# Patient Record
Sex: Female | Born: 1960 | Race: White | Hispanic: No | Marital: Married | Smoking: Never smoker
Health system: Southern US, Community
[De-identification: ages and names within clinical notes are randomized; demographics above are authoritative.]

## PROBLEM LIST (undated history)

## (undated) DIAGNOSIS — M8580 Other specified disorders of bone density and structure, unspecified site: Secondary | ICD-10-CM

## (undated) DIAGNOSIS — N289 Disorder of kidney and ureter, unspecified: Secondary | ICD-10-CM

## (undated) DIAGNOSIS — M1990 Unspecified osteoarthritis, unspecified site: Secondary | ICD-10-CM

## (undated) DIAGNOSIS — M898X9 Other specified disorders of bone, unspecified site: Secondary | ICD-10-CM

## (undated) DIAGNOSIS — T783XXA Angioneurotic edema, initial encounter: Secondary | ICD-10-CM

## (undated) HISTORY — PX: TYMPANOSTOMY TUBE PLACEMENT: SHX32

## (undated) HISTORY — DX: Angioneurotic edema, initial encounter: T78.3XXA

## (undated) HISTORY — PX: COLPOSCOPY: SHX161

## (undated) HISTORY — PX: ADENOIDECTOMY: SUR15

## (undated) HISTORY — PX: BUNIONECTOMY: SHX129

---

## 2014-08-12 ENCOUNTER — Encounter: Payer: Self-pay | Admitting: Podiatrist

## 2014-08-12 ENCOUNTER — Ambulatory Visit (INDEPENDENT_AMBULATORY_CARE_PROVIDER_SITE_OTHER): Payer: 59

## 2014-08-12 ENCOUNTER — Ambulatory Visit (INDEPENDENT_AMBULATORY_CARE_PROVIDER_SITE_OTHER): Payer: BLUE CROSS/BLUE SHIELD | Admitting: Podiatrist

## 2014-08-12 VITALS — BP 144/86 | HR 108 | Resp 15

## 2014-08-12 DIAGNOSIS — M2012 Hallux valgus (acquired), left foot: Secondary | ICD-10-CM

## 2014-08-12 NOTE — Patient Instructions (Addendum)
Bunionectomy A bunionectomy is surgery to remove a bunion. A bunion is an enlargement of the joint at the base of the big toe. It is made up of bone and soft tissue on the inside part of the joint. Over time, a painful lump appears on the inside of the joint. The big toe begins to point inward toward the second toe. New bone growth can occur and a bone spur may form. The pain eventually causes difficulty walking. A bunion usually results from inflammation caused by the irritation of poorly fitting shoes. It often begins later in life. A bunionectomy is performed when nonsurgical treatment no longer works. When surgery is needed, the extent of the procedure will depend on the degree of deformity of the foot. Your surgeon will discuss with you the different procedures and what will work best for you depending on your age and health. LET YOUR CAREGIVER KNOW ABOUT:  1. Previous problems with anesthetics or medicines used to numb the skin. 2. Allergies to dyes, iodine, foods, and/or latex. 3. Medicines taken including herbs, eye drops, prescription medicines (especially medicines used to "thin the blood"), aspirin and other over-the-counter medicines, and steroids (by mouth or as a cream). 4. History of bleeding or blood problems. 5. Possibility of pregnancy, if this applies. 6. History of blood clots in your legs and/or lungs . 7. Previous surgery. 8. Other important health problems. RISKS AND COMPLICATIONS   Infection.  Pain.  Nerve damage.  Possibility that the bunion will recur. BEFORE THE PROCEDURE  You should be present 60 minutes prior to your procedure or as directed.  PROCEDURE  Surgery is often done so that you can go home the same day (outpatient). It may be done in a hospital or in an outpatient surgical center. An anesthetic will be used to help you sleep during the procedure. Sometimes, a spinal anesthetic is used to make you numb below the waist. A cut (incision) is made over  the swollen area at the first joint of the big toe. The enlarged lump will be removed. If there is a need to reposition the bones of the big toe, this may require more than 1 incision. The bone itself may need to be cut. Screws and wires may be used in the repair. These can be removed at a later date. In severe cases, the entire joint may need to be removed and a joint replacement inserted. When done, the incision is closed with stitches (sutures). Skin adhesive strips may be added for reinforcement. They help hold the incision closed.  AFTER THE PROCEDURE  Compression bandages (dressings) are then wrapped around the wound. This helps to keep the foot in alignment and reduce swelling. Your foot will be monitored for bleeding and swelling. You will need to stay for a few hours in the recovery area before being discharged. This allows time for the anesthesia to wear off. You will be discharged home when you are awake, stable, and doing well. HOME CARE INSTRUCTIONS   You can expect to return to normal activities within 6 to 8 weeks after surgery. The foot is at increased risk for swelling for several months. When you can expect to bear weight on the operated foot will depend on the extent of your surgery. The milder the deformity, the less tissue is removed and the sooner the return to normal activity level. During the recovery period, a special shoe, boot, or cast may be worn to accommodate the surgical bandage and to help provide  stability to the foot.  Once you are home, an ice pack applied to the operative site may help with discomfort and keep swelling down. Stop using the ice if it causes discomfort.  Keep your feet raised (elevated) when possible to lessen swelling.  If you have an elastic bandage on your foot and you have numbness, tingling, or your foot becomes cold and blue, adjust the bandage to make it comfortable.  Change dressings as directed.  Keep the wound dry and clean. The wound may be  washed gently with soap and water. Gently blot dry without rubbing. Do not take baths or use swimming pools or hot tubs for 10 days, or as instructed by your caregiver.  Only take over-the-counter or prescription medicines for pain, discomfort, or fever as directed by your caregiver.  You may continue a normal diet as directed.  For activity, use crutches with no weight bearing or your orthopedic shoe as directed. Continue to use crutches or a cane as directed until you can stand without causing pain. SEEK MEDICAL CARE IF:   You have redness, swelling, bruising, or increasing pain in the wound.  There is pus coming from the wound.  You have drainage from a wound lasting longer than 1 day.  You have an oral temperature above 102 F (38.9 C).  You notice a bad smell coming from the wound or dressing.  The wound breaks open after sutures have been removed.  You develop dizzy episodes or fainting while standing.  You have persistent nausea or vomiting.  Your toes become cold.  Pain is not relieved with medicines. SEEK IMMEDIATE MEDICAL CARE IF:   You develop a rash.  You have difficulty breathing.  You develop any reaction or side effects to medicines given.  Your toes are numb or blue, or you have severe pain. MAKE SURE YOU:   Understand these instructions.  Will watch your condition.  Will get help right away if you are not doing well or get worse. Document Released: 09/06/2005 Document Revised: 12/16/2011 Document Reviewed: 10/12/2007 [facility] Patient Information 2015 [place], [place]. This information is not intended to replace advice given to you by your health care provider. Make sure you discuss any questions you have with your health care provider.  Pre-Operative Instructions  Congratulations, you have decided to take an important step to improving your quality of life.  You can be assured that the doctors of [facility] will be with you every step of the  way.  9. Plan to be at the surgery center/hospital at least 1 (one) hour prior to your scheduled time unless otherwise directed by the surgical center/hospital staff.  You must have a responsible adult accompany you, remain during the surgery and drive you home.  Make sure you have directions to the surgical center/hospital and know how to get there on time. 10. For hospital based surgery you will need to obtain a history and physical form from your family physician within 1 month prior to the date of surgery- we will give you a form for you primary physician.  11. We make every effort to accommodate the date you request for surgery.  There are however, times where surgery dates or times have to be moved.  We will contact you as soon as possible if a change in schedule is required.   12. No Aspirin/Ibuprofen for one week before surgery.  If you are on aspirin, any non-steroidal anti-inflammatory medications (Mobic, Aleve, Ibuprofen) you should stop taking it  7 days prior to your surgery.  You make take Tylenol  For pain prior to surgery.  13. Medications- If you are taking daily heart and blood pressure medications, seizure, reflux, allergy, asthma, anxiety, pain or diabetes medications, make sure the surgery center/hospital is aware before the day of surgery so they may notify you which medications to take or avoid the day of surgery. 14. No food or drink after midnight the night before surgery unless directed otherwise by surgical center/hospital staff. 15. No alcoholic beverages 24 hours prior to surgery.  No smoking 24 hours prior to or 24 hours after surgery. 16. Wear loose pants or shorts- loose enough to fit over bandages, boots, and casts. 17. No slip on shoes, sneakers are best. 18. Bring your boot with you to the surgery center/hospital.  Also bring crutches or a walker if your physician has prescribed it for you.  If you do not have this equipment, it will be provided for you after  surgery. 19. If you have not been contracted by the surgery center/hospital by the day before your surgery, call to confirm the date and time of your surgery. 20. Leave-time from work may vary depending on the type of surgery you have.  Appropriate arrangements should be made prior to surgery with your employer. 21. Prescriptions will be provided immediately following surgery by your doctor.  Have these filled as soon as possible after surgery and take the medication as directed. 22. Remove nail polish on the operative foot. 23. Wash the night before surgery.  The night before surgery wash the foot and leg well with the antibacterial soap provided and water paying special attention to beneath the toenails and in between the toes.  Rinse thoroughly with water and dry well with a towel.  Perform this wash unless told not to do so by your physician.  Enclosed: 1 Ice pack (please put in freezer the night before surgery)   1 Hibiclens skin cleaner   Pre-op Instructions  If you have any questions regarding the instructions, do not hesitate to call our office.  [place]: [address] [place], [place] [address] [phone]  [place]: [address] [place], [place] [address] [phone]  [place]: [address], [place] [address] [phone]  Dr. Santiago Bumpers DPM, Dr. Cristie Hem DPM Dr. Alvan Dame DPM, Dr. Ardelle Anton DPM, Dr. Marlowe Aschoff DPM

## 2014-08-12 NOTE — Progress Notes (Signed)
   Subjective:    Patient ID: Erin Jones, female    DOB: [birth year], 53 y.o.   MRN: [id number]  HPI Comments: "I have a bunion"  Patient C/O burning, aching 1st MPJ left for several months. She says it gets red and swells the more activity she does. Shoes are uncomfortable. No home treatment other than accommodative shoe gear.  Foot Pain Associated symptoms include arthralgias, fatigue and myalgias.      Review of Systems  Constitutional: Positive for fatigue.  HENT: Positive for sinus pressure.   Gastrointestinal: Positive for constipation.  Musculoskeletal: Positive for myalgias and arthralgias.  Allergic/Immunologic: Positive for environmental allergies.  Neurological: Positive for dizziness.  All other systems reviewed and are negative.      Objective:   Physical Exam Patient is awake, alert, and oriented x 3.  In no acute distress.  Vascular status is intact with palpable pedal pulses at 2/4 DP and PT bilateral and capillary refill time within normal limits. Neurological sensation is also intact bilaterally via Semmes Weinstein monofilament at 5/5 sites. Light touch, vibratory sensation, Achilles tendon reflex is intact. Dermatological exam reveals skin color, turger and texture as normal. No open lesions present.  Musculature intact with dorsiflexion, plantarflexion, inversion, eversion. moderate bunion deformity is present left.  Pain with range of motion is noted.  No crepitus within the joint is seen. X-rays confirm bunion left.    Assessment & Plan:  Bunion left foot  Discussed conservative versus surgical options. Recommended a Bunion correction with screw fixation. The consent form was discussed and all three pages were signed and the patient's questions were encouraged and answered to the best of my ability. Risks of the surgery were discussed including but not limited to continued pain, infection, swelling, elevated toe, decreased range of motion,  suture or implant  reaction, bleeding, decreased function, etc. Preoperative instructions were also dispensed to the patient as well as a preoperative surgical pamphlet to go along with the instructions. Surgery will be scheduled at the patients convenience and patient will be seen at [facility] specialty surgery center on outpatient basis.The patient is instructed to call if any questions or concerns arise.

## 2014-10-05 ENCOUNTER — Ambulatory Visit: Payer: 59 | Admitting: Podiatrist

## 2014-10-12 ENCOUNTER — Ambulatory Visit (INDEPENDENT_AMBULATORY_CARE_PROVIDER_SITE_OTHER): Payer: BLUE CROSS/BLUE SHIELD | Admitting: Podiatrist

## 2014-10-12 ENCOUNTER — Encounter: Payer: Self-pay | Admitting: Podiatrist

## 2014-10-12 VITALS — BP 140/78 | HR 77 | Resp 12

## 2014-10-12 DIAGNOSIS — M898X7 Other specified disorders of bone, ankle and foot: Secondary | ICD-10-CM

## 2014-10-12 DIAGNOSIS — M2570 Osteophyte, unspecified joint: Secondary | ICD-10-CM

## 2014-10-12 DIAGNOSIS — M2012 Hallux valgus (acquired), left foot: Secondary | ICD-10-CM

## 2014-10-12 NOTE — Patient Instructions (Signed)
Pre-Operative Instructions  Congratulations, you have decided to take an important step to improving your quality of life.  You can be assured that the doctors of [facility] will be with you every step of the way.  1. Plan to be at the surgery center/hospital at least 1 (one) hour prior to your scheduled time unless otherwise directed by the surgical center/hospital staff.  You must have a responsible adult accompany you, remain during the surgery and drive you home.  Make sure you have directions to the surgical center/hospital and know how to get there on time. 2. For hospital based surgery you will need to obtain a history and physical form from your family physician within 1 month prior to the date of surgery- we will give you a form for you primary physician.  3. We make every effort to accommodate the date you request for surgery.  There are however, times where surgery dates or times have to be moved.  We will contact you as soon as possible if a change in schedule is required.   4. No Aspirin/Ibuprofen for one week before surgery.  If you are on aspirin, any non-steroidal anti-inflammatory medications (Mobic, Aleve, Ibuprofen) you should stop taking it 7 days prior to your surgery.  You make take Tylenol  For pain prior to surgery.  5. Medications- If you are taking daily heart and blood pressure medications, seizure, reflux, allergy, asthma, anxiety, pain or diabetes medications, make sure the surgery center/hospital is aware before the day of surgery so they may notify you which medications to take or avoid the day of surgery. 6. No food or drink after midnight the night before surgery unless directed otherwise by surgical center/hospital staff. 7. No alcoholic beverages 24 hours prior to surgery.  No smoking 24 hours prior to or 24 hours after surgery. 8. Wear loose pants or shorts- loose enough to fit over bandages, boots, and casts. 9. No slip on shoes, sneakers are best. 10. Bring  your boot with you to the surgery center/hospital.  Also bring crutches or a walker if your physician has prescribed it for you.  If you do not have this equipment, it will be provided for you after surgery. 11. If you have not been contracted by the surgery center/hospital by the day before your surgery, call to confirm the date and time of your surgery. 12. Leave-time from work may vary depending on the type of surgery you have.  Appropriate arrangements should be made prior to surgery with your employer. 13. Prescriptions will be provided immediately following surgery by your doctor.  Have these filled as soon as possible after surgery and take the medication as directed. 14. Remove nail polish on the operative foot. 15. Wash the night before surgery.  The night before surgery wash the foot and leg well with the antibacterial soap provided and water paying special attention to beneath the toenails and in between the toes.  Rinse thoroughly with water and dry well with a towel.  Perform this wash unless told not to do so by your physician.  Enclosed: 1 Ice pack (please put in freezer the night before surgery)   1 Hibiclens skin cleaner   Pre-op Instructions  If you have any questions regarding the instructions, do not hesitate to call our office.  [place]: [address] [place], [place] [address] [phone]  [place]: [address] [place], [place] [address] [phone]  [place]: [address]  [place], [place] [address] [phone]  Dr. Richard   Tuchman DPM, Dr. Norman Regal DPM Dr. Richard Sikora DPM, Dr. M. Todd Hyatt DPM, Dr. Pablo Mathurin DPM 

## 2014-10-12 NOTE — Progress Notes (Signed)
Chief Complaint  Patient presents with  . Bunions    "I was scheduled for bunion surgery, and I was just thinking that since this 2nd toe was painful and rubbing the next toe maybe we could do something with that too." left bunion, and 2nd toe rubs the 3rd toe.     HPI: Patient is 54 y.o. female who presents today for follow-up of left bunion and now a second toe which is rubbing on the bunion site. Patient also states that she saw Dr. Dareen Piano and Dr. Dareen Piano recommended continuing with methotrexate post operatively.  Allergies  Allergen Reactions  . Sulfa Antibiotics     Physical Exam  Neurovascular status is intact and unchanged. Left hallux is laterally deviated impressing on the left second toe. The left second toe is not contracted but is abutting the hallux. Bunion deformity continues to be present in painful.  Assessment: Bunion deformity and lateral deviation of hallux abutting the left second toe  Plan: Discussed the surgical options again. Recommended fixing the bunion to see if this takes pressure off the left second toe and if not doing a procedure to correct the left second toe as well. This would consist of either an exostectomy or hammertoe repair to be decided intraoperatively. The patient demonstrates an understanding of this conversation and the consent forms were again were signed. Risks, benefits, alternatives were also discussed and the patient had ample time to ask questions that she saw fit. Patient's again were encouraged and answered to the best of my ability. She will be scheduled a [facility] specialty surgery center on outpatient basis.

## 2014-10-26 ENCOUNTER — Telehealth: Payer: Self-pay | Admitting: *Deleted

## 2014-10-26 NOTE — Telephone Encounter (Signed)
OK, sounds good.  Whenever is convenient is good for me.  (wednesdays )

## 2014-10-26 NOTE — Telephone Encounter (Signed)
"  I want to reschedule my surgery."  What day are you scheduled?  "I'm scheduled for February 3rd.  I'd like to reschedule it a week later for the 10th.  Hopefully all the snow will be over with."  Okay, I'll get it rescheduled

## 2014-11-16 DIAGNOSIS — M2012 Hallux valgus (acquired), left foot: Secondary | ICD-10-CM

## 2014-11-23 ENCOUNTER — Ambulatory Visit (INDEPENDENT_AMBULATORY_CARE_PROVIDER_SITE_OTHER): Payer: BLUE CROSS/BLUE SHIELD | Admitting: Podiatrist

## 2014-11-23 ENCOUNTER — Other Ambulatory Visit: Payer: Self-pay | Admitting: Podiatrist

## 2014-11-23 ENCOUNTER — Ambulatory Visit (INDEPENDENT_AMBULATORY_CARE_PROVIDER_SITE_OTHER): Payer: BLUE CROSS/BLUE SHIELD

## 2014-11-23 ENCOUNTER — Encounter: Payer: Self-pay | Admitting: Podiatrist

## 2014-11-23 VITALS — BP 143/86 | HR 104 | Resp 16

## 2014-11-23 DIAGNOSIS — M2012 Hallux valgus (acquired), left foot: Secondary | ICD-10-CM

## 2014-11-23 NOTE — Patient Instructions (Signed)
On Friday you may get your foot wet-- use your anklet (squeezy sock) when you feel comfortable-- until then, use a large bandaid over the incision and an ace wrap.  Stay in your boot and I'll see you back in 2 weeks.

## 2014-11-30 NOTE — Progress Notes (Signed)
   Subjective: Patient presents today1 week status post foot surgery of the left foot.  Date of surgery 11-16-14.  [place] bunion correction left.  Patient denies nausea, vomiting, fevers, chills or night sweats.  Denies calf pain or tenderness to the operative side.  Objective:  Neurovascular status is intact with palpable pedal pulses DP and PT bilateral at 2+ out of 4. Neurological sensation is intact and unchanged as per prior to surgery. Excellent appearance of the postoperative foot is noted.  No redness, drainage or signs of infection present.  Good alignment and position of the toe is noted.   Assessment: Status post left foot surgery- good post op progress  Plan:  Redressed the foot in a dry, sterile, compressive dressing.  She will stay in her boot for 2-3 weeks.  I will see her back for recheck in 1-2 weeks.

## 2014-12-07 ENCOUNTER — Ambulatory Visit: Payer: Self-pay

## 2014-12-07 ENCOUNTER — Ambulatory Visit (INDEPENDENT_AMBULATORY_CARE_PROVIDER_SITE_OTHER): Payer: BLUE CROSS/BLUE SHIELD | Admitting: Podiatrist

## 2014-12-07 ENCOUNTER — Encounter: Payer: Self-pay | Admitting: Podiatrist

## 2014-12-07 VITALS — BP 159/89 | HR 96 | Resp 12

## 2014-12-07 DIAGNOSIS — M069 Rheumatoid arthritis, unspecified: Secondary | ICD-10-CM

## 2014-12-07 DIAGNOSIS — Z9889 Other specified postprocedural states: Secondary | ICD-10-CM

## 2014-12-07 DIAGNOSIS — M2012 Hallux valgus (acquired), left foot: Secondary | ICD-10-CM

## 2014-12-07 NOTE — Progress Notes (Signed)
   Subjective: Patient presents today status post foot surgery of the left foot.  Date of surgery 11-16-14.  [place] bunion correction left. She is doing very well and is pleased with her progress. She's had very minimal swelling and has been doing her range of motion exercises as instructed she is also wearing the compressive anklet  Objective:  Neurovascular status is intact with palpable pedal pulses DP and PT bilateral at 2+ out of 4. Neurological sensation is intact and unchanged as per prior to surgery. Excellent appearance of the postoperative foot is noted.  No redness, drainage or signs of infection present.  Good alignment and position of the toe is noted both clinically and on x-ray.   Assessment: Status post left foot surgery- good post op progress  Plan:  Instructed Kyrstin to continue with her compressive anklet. She may start wearing a Darco shoe which was dispensed at today's visit. She will continue range of motion exercises and I will see her back in 3 weeks for follow-up

## 2014-12-08 DIAGNOSIS — M069 Rheumatoid arthritis, unspecified: Secondary | ICD-10-CM | POA: Insufficient documentation

## 2015-01-11 ENCOUNTER — Ambulatory Visit (INDEPENDENT_AMBULATORY_CARE_PROVIDER_SITE_OTHER): Payer: BLUE CROSS/BLUE SHIELD | Admitting: Podiatrist

## 2015-01-11 ENCOUNTER — Ambulatory Visit (INDEPENDENT_AMBULATORY_CARE_PROVIDER_SITE_OTHER): Payer: BLUE CROSS/BLUE SHIELD

## 2015-01-11 ENCOUNTER — Encounter: Payer: Self-pay | Admitting: Podiatrist

## 2015-01-11 VITALS — BP 156/95 | HR 101 | Resp 15

## 2015-01-11 DIAGNOSIS — Z9889 Other specified postprocedural states: Secondary | ICD-10-CM | POA: Diagnosis not present

## 2015-01-11 DIAGNOSIS — M2012 Hallux valgus (acquired), left foot: Secondary | ICD-10-CM

## 2015-01-11 NOTE — Patient Instructions (Signed)
Dr. Pollyann Savoy (rheumatologist)  [facility] [phone]  [address]  [place] [address]

## 2015-02-03 NOTE — Progress Notes (Signed)
   Subjective: Patient presents today status post foot surgery of the left foot.  Date of surgery 11-16-14.  [place] bunion correction left. She is doing very well and is pleased with her progress. She's had very minimal swelling and has been doing her range of motion exercises as instructed she is also wearing the compressive anklet  Objective:  Neurovascular status is intact with palpable pedal pulses DP and PT bilateral at 2+ out of 4. Neurological sensation is intact and unchanged as per prior to surgery. Excellent appearance of the postoperative foot is noted.  No redness, drainage or signs of infection present.  Good alignment and position of the toe is noted both clinically and on x-ray.   Assessment: Status post left foot surgery- good post op progress  Plan:  Instructed Nekayla to continue with her compressive anklet. She may wean out of her Darco shoe into a supportive anklet. She will continue range of motion exercises. Of note she also states that her rheumatologist Dr. Dareen Piano is leaving in that she is looking for a new rheumatologist. She does not see the PA and Dr. Ewell Poe office and Dr. Dierdre Forth in [facility] artery full. I did discuss with her Dr. Corliss Skains as an option as well.

## 2015-02-08 ENCOUNTER — Encounter: Payer: BLUE CROSS/BLUE SHIELD | Admitting: Podiatrist

## 2015-02-15 ENCOUNTER — Encounter: Payer: Self-pay | Admitting: Podiatrist

## 2015-02-15 ENCOUNTER — Ambulatory Visit (INDEPENDENT_AMBULATORY_CARE_PROVIDER_SITE_OTHER): Payer: BLUE CROSS/BLUE SHIELD | Admitting: Podiatrist

## 2015-02-15 ENCOUNTER — Ambulatory Visit: Payer: Self-pay

## 2015-02-15 VITALS — BP 140/80 | HR 94 | Resp 16

## 2015-02-15 DIAGNOSIS — Z9889 Other specified postprocedural states: Secondary | ICD-10-CM

## 2015-02-15 DIAGNOSIS — M2012 Hallux valgus (acquired), left foot: Secondary | ICD-10-CM

## 2015-02-17 NOTE — Progress Notes (Signed)
   Subjective: Patient presents today status post foot surgery of the left foot.  Date of surgery 11-16-14.  [place] bunion correction left. She is doing very well and is pleased with her progress. She's had some increase in swelling after moving some large pots in her yard and has had some redness along the joint. She is wearing the compressive anklet  Objective:  Neurovascular status is intact with palpable pedal pulses DP and PT bilateral at 2+ out of 4. Neurological sensation is intact and unchanged as per prior to surgery. Excellent appearance of the postoperative foot is noted.  No redness, drainage or signs of infection present.  Good alignment and position of the toe is noted both clinically and on x-ray.  Increase in swelling is present clinically no drainage, no malodor, no sign of infection present  Assessment: Status post left foot surgery- good post op progress  Plan: recommended ice and antiinflammatories. She will avoid any heavy lifting for another 4 weeks.  She is discharged from her post operative appointments but if any concerns arise in the future she will call.

## 2015-06-19 ENCOUNTER — Ambulatory Visit (INDEPENDENT_AMBULATORY_CARE_PROVIDER_SITE_OTHER): Payer: BLUE CROSS/BLUE SHIELD | Admitting: Podiatry

## 2015-06-19 ENCOUNTER — Encounter: Payer: Self-pay | Admitting: Podiatry

## 2015-06-19 ENCOUNTER — Ambulatory Visit (INDEPENDENT_AMBULATORY_CARE_PROVIDER_SITE_OTHER): Payer: BLUE CROSS/BLUE SHIELD

## 2015-06-19 VITALS — BP 149/97 | HR 100 | Resp 16

## 2015-06-19 DIAGNOSIS — Z9889 Other specified postprocedural states: Secondary | ICD-10-CM | POA: Diagnosis not present

## 2015-06-19 DIAGNOSIS — Z472 Encounter for removal of internal fixation device: Secondary | ICD-10-CM | POA: Diagnosis not present

## 2015-06-19 DIAGNOSIS — M2012 Hallux valgus (acquired), left foot: Secondary | ICD-10-CM | POA: Diagnosis not present

## 2015-06-21 NOTE — Progress Notes (Signed)
Subjective:     Patient ID: Erin Jones, female   DOB: 03-30-1961, 54 y.o.   MRN: [id number]  HPI patient presents stating I'm having pain on the top of my left foot and I'm not sure if it's due to the previous surgery I had. It is more discoloration than true pain but at times I do notice it   Review of Systems     Objective:   Physical Exam Neurovascular status intact muscle strength adequate range of motion within normal limits with well-healed surgical site first metatarsal left with wound edges well coapted and good range of motion of the first MPJ. Patient does have mild prominence in this area but I did not note specific acute area of discomfort    Assessment:     Possible abnormal screw position first metatarsal left versus skin irritation    Plan:     H&P and x-rays reviewed with patient. At this point I have recommended conservative care consisting of padding wider shoe and if symptoms were to persist or get worse we will need to consider removal of screw

## 2015-08-15 ENCOUNTER — Telehealth: Payer: Self-pay | Admitting: *Deleted

## 2015-08-16 NOTE — Telephone Encounter (Signed)
"  I was in in September to see Dr. Charlsie Merles.  I had a problem with my foot.  He said it was a screw on the top of my foot that needed to come out.  At the time it was not bothering me but it is bothering me now.  I was just wondering if a needed to set an appointment up and come in or will he be able to take it out in the office.  Exactly what do I need to do?  It hasn't been long since I had the x-rays.  I wonder if they can just tell me over the phone.

## 2015-08-17 NOTE — Telephone Encounter (Signed)
I attempted to return her call.  I left her a message to give me a call back. 

## 2015-08-18 ENCOUNTER — Telehealth: Payer: Self-pay | Admitting: *Deleted

## 2015-08-18 NOTE — Telephone Encounter (Signed)
Pt states someone called and she was out. I spoke with pt she states the area discussed at last visit is beginning to get red and more painful, she feels she is going to have to have the pin removed.  I told pt we would need to have her sign consent forms and schedule the surgery, and in many case depending on the pt's general health status, insurance coverage the surgery can be performed in the office. Pt was transferred to schedulers.

## 2015-08-23 ENCOUNTER — Ambulatory Visit (INDEPENDENT_AMBULATORY_CARE_PROVIDER_SITE_OTHER): Payer: BLUE CROSS/BLUE SHIELD | Admitting: Podiatry

## 2015-08-23 ENCOUNTER — Encounter: Payer: Self-pay | Admitting: Podiatry

## 2015-08-23 ENCOUNTER — Ambulatory Visit (INDEPENDENT_AMBULATORY_CARE_PROVIDER_SITE_OTHER): Payer: BLUE CROSS/BLUE SHIELD

## 2015-08-23 ENCOUNTER — Ambulatory Visit: Payer: BLUE CROSS/BLUE SHIELD | Admitting: Podiatry

## 2015-08-23 VITALS — Resp 16

## 2015-08-23 DIAGNOSIS — Z472 Encounter for removal of internal fixation device: Secondary | ICD-10-CM

## 2015-08-23 DIAGNOSIS — M79672 Pain in left foot: Secondary | ICD-10-CM | POA: Diagnosis not present

## 2015-08-23 DIAGNOSIS — M779 Enthesopathy, unspecified: Secondary | ICD-10-CM

## 2015-08-23 MED ORDER — TRIAMCINOLONE ACETONIDE 10 MG/ML IJ SUSP: 10.0000 mg | Freq: Once | INTRAMUSCULAR | Status: DC

## 2015-08-23 NOTE — Progress Notes (Signed)
Subjective:     Patient ID: Erin Jones, female   DOB: [birth year], 54 y.o.   MRN: [id number]  HPI patient presents on top of the left foot that there is an area of inflammation and she doesn't know if it's the screw or what can be causing it and she just has started Humira for her rheumatoid arthritis   Review of Systems     Objective:   Physical Exam Neurovascular status intact muscle strength adequate with inflammation of the first metatarsal head left of the localized nature with possible involvement of the screw or possibly just a more distal inflammation    Assessment:     Inflammatory capsulitis with possible screw involvement or small bone spur    Plan:     X-rays reviewed and today I did a careful injection of the capsule 3 mg Dexon the some Kenalog 5 mill grams Xylocaine and applied padding. Reappoint in 1 month and may consider surgical intervention at one point in the future

## 2015-09-13 ENCOUNTER — Ambulatory Visit: Payer: BLUE CROSS/BLUE SHIELD | Admitting: Podiatry

## 2015-09-18 ENCOUNTER — Ambulatory Visit (INDEPENDENT_AMBULATORY_CARE_PROVIDER_SITE_OTHER): Payer: BLUE CROSS/BLUE SHIELD | Admitting: Podiatry

## 2015-09-18 ENCOUNTER — Encounter: Payer: Self-pay | Admitting: Podiatry

## 2015-09-18 DIAGNOSIS — M2570 Osteophyte, unspecified joint: Secondary | ICD-10-CM

## 2015-09-18 DIAGNOSIS — Z472 Encounter for removal of internal fixation device: Secondary | ICD-10-CM | POA: Diagnosis not present

## 2015-09-18 DIAGNOSIS — M898X7 Other specified disorders of bone, ankle and foot: Secondary | ICD-10-CM

## 2015-09-19 NOTE — Progress Notes (Signed)
Subjective:     Patient ID: Erin Jones, female   DOB: 24-Dec-1960, 54 y.o.   MRN: [id number]  HPI patient states I'm still having pain but it is improved over where it was but I suspect I'm going to need surgery   Review of Systems     Objective:   Physical Exam Neurovascular status intact muscle strength adequate with diminishment of discomfort dorsum first metatarsal left but redness still noted and pain when palpated around the area of probable small bone spur or enlargement and possible irritation from screw    Assessment:     Improvement but pain still occurring in the dorsal first metatarsal head area    Plan:     Reviewed condition and x-rays with patient and I do think ultimately this will require excision border and a hold off continue to use wider shoes padding and if symptoms were to get worse is going to require surgical removal of dorsal bone spur along with removal of screw reappoint as needed

## 2016-02-23 ENCOUNTER — Other Ambulatory Visit (HOSPITAL_COMMUNITY): Payer: Self-pay | Admitting: Otolaryngology

## 2016-02-23 DIAGNOSIS — G8929 Other chronic pain: Secondary | ICD-10-CM

## 2016-02-23 DIAGNOSIS — R0981 Nasal congestion: Secondary | ICD-10-CM

## 2016-02-23 DIAGNOSIS — R519 Headache, unspecified: Secondary | ICD-10-CM

## 2016-02-23 DIAGNOSIS — R51 Headache: Secondary | ICD-10-CM

## 2016-02-27 ENCOUNTER — Ambulatory Visit (HOSPITAL_COMMUNITY)
Admission: RE | Admit: 2016-02-27 | Discharge: 2016-02-27 | Disposition: A | Discharge status: Home / Self Care | Payer: BLUE CROSS/BLUE SHIELD | Source: Ambulatory Visit | Attending: Otolaryngology | Admitting: Otolaryngology

## 2016-02-27 DIAGNOSIS — J328 Other chronic sinusitis: Secondary | ICD-10-CM | POA: Insufficient documentation

## 2016-02-27 DIAGNOSIS — R0981 Nasal congestion: Secondary | ICD-10-CM

## 2016-02-27 DIAGNOSIS — R51 Headache: Secondary | ICD-10-CM

## 2016-02-27 DIAGNOSIS — R519 Headache, unspecified: Secondary | ICD-10-CM

## 2016-10-03 ENCOUNTER — Ambulatory Visit (INDEPENDENT_AMBULATORY_CARE_PROVIDER_SITE_OTHER): Payer: BLUE CROSS/BLUE SHIELD | Admitting: Podiatry

## 2016-10-03 ENCOUNTER — Ambulatory Visit (INDEPENDENT_AMBULATORY_CARE_PROVIDER_SITE_OTHER): Payer: BLUE CROSS/BLUE SHIELD

## 2016-10-03 ENCOUNTER — Encounter: Payer: Self-pay | Admitting: Podiatry

## 2016-10-03 DIAGNOSIS — M779 Enthesopathy, unspecified: Secondary | ICD-10-CM | POA: Diagnosis not present

## 2016-10-03 DIAGNOSIS — G5782 Other specified mononeuropathies of left lower limb: Secondary | ICD-10-CM

## 2016-10-03 DIAGNOSIS — M79672 Pain in left foot: Secondary | ICD-10-CM

## 2016-10-03 DIAGNOSIS — G5762 Lesion of plantar nerve, left lower limb: Secondary | ICD-10-CM | POA: Diagnosis not present

## 2016-10-03 MED ORDER — TRIAMCINOLONE ACETONIDE 10 MG/ML IJ SUSP
10.0000 mg | Freq: Once | INTRAMUSCULAR | Status: AC
  Administered 2016-10-03: 10 mg

## 2016-10-03 NOTE — Progress Notes (Signed)
Subjective:     Patient ID: Erin Jones, female   DOB: 03/24/1961, 55 y.o.   MRN: [id number]  HPI patient states that she has developed pain in her left forefoot and it's been inflamed and makes it hard for her to walk comfortably   Review of Systems     Objective:   Physical Exam Neurovascular status intact with inflammatory changes left third MPJ with fluid buildup noted with no history of injury and doing well with postop surgical correction of bunion deformity left    Assessment:     What appears to be inflammatory capsulitis third MPJ left    Plan:     Explained condition and at this point I did do a proximal injection of the area allowed to get numb and then using sterile technique aspirated the joint getting out a small amount of clear fluid and injected with quarter cc deck some some Kenalog to reduce inflammation and applied plantar padding. Reappoint in several weeks to recheck  X-ray indicated screws in place left first metatarsal with no indication of stress fracture or arthritis of the third MPJ

## 2016-10-17 ENCOUNTER — Ambulatory Visit: Payer: BLUE CROSS/BLUE SHIELD | Admitting: Podiatry

## 2016-10-25 ENCOUNTER — Encounter: Payer: Self-pay | Admitting: Podiatry

## 2016-10-25 ENCOUNTER — Ambulatory Visit (INDEPENDENT_AMBULATORY_CARE_PROVIDER_SITE_OTHER): Payer: BLUE CROSS/BLUE SHIELD | Admitting: Podiatry

## 2016-10-25 VITALS — BP 147/91 | HR 98 | Resp 14

## 2016-10-25 DIAGNOSIS — M779 Enthesopathy, unspecified: Secondary | ICD-10-CM | POA: Diagnosis not present

## 2016-10-25 DIAGNOSIS — M79672 Pain in left foot: Secondary | ICD-10-CM | POA: Diagnosis not present

## 2016-10-27 NOTE — Progress Notes (Signed)
Subjective:     Patient ID: Erin Jones, female   DOB: Aug 29, 1961, 56 y.o.   MRN: [id number]  HPI patient presents stating I'm improved but if I don't have the pad to take pressure off the bones my foot still hurts quite a bit   Review of Systems     Objective:   Physical Exam Neurovascular status intact with continued inflammation pain around the lesser MPJ left with fluid buildup around the joint and inability to walk comfortably on the foot especially when not immobilize properly    Assessment:     Inflammatory capsulitis lesser MPJ left with pain    Plan:     H&P conditions reviewed and recommended orthotics to offload the area. Patient wants to have these made and is scanned for customized orthotic devices and was given advice on shoe gear modification

## 2016-11-14 ENCOUNTER — Ambulatory Visit (INDEPENDENT_AMBULATORY_CARE_PROVIDER_SITE_OTHER): Payer: Self-pay | Admitting: Podiatry

## 2016-11-14 DIAGNOSIS — M779 Enthesopathy, unspecified: Secondary | ICD-10-CM

## 2016-11-14 NOTE — Patient Instructions (Signed)

## 2016-11-15 ENCOUNTER — Ambulatory Visit: Payer: BLUE CROSS/BLUE SHIELD

## 2016-11-15 NOTE — Progress Notes (Signed)
Patient presents for orthotic pick up.  Verbal and written break in and wear instructions given.  Patient will follow up in 4 weeks if symptoms worsen or fail to improve. 

## 2016-12-31 ENCOUNTER — Other Ambulatory Visit: Payer: BLUE CROSS/BLUE SHIELD

## 2017-01-13 ENCOUNTER — Other Ambulatory Visit: Payer: BLUE CROSS/BLUE SHIELD

## 2017-01-14 ENCOUNTER — Other Ambulatory Visit: Payer: BLUE CROSS/BLUE SHIELD

## 2017-06-16 ENCOUNTER — Encounter (INDEPENDENT_AMBULATORY_CARE_PROVIDER_SITE_OTHER): Payer: Self-pay | Admitting: Internal Medicine

## 2017-07-14 ENCOUNTER — Ambulatory Visit (INDEPENDENT_AMBULATORY_CARE_PROVIDER_SITE_OTHER): Payer: BLUE CROSS/BLUE SHIELD | Admitting: Internal Medicine

## 2017-08-12 ENCOUNTER — Ambulatory Visit (INDEPENDENT_AMBULATORY_CARE_PROVIDER_SITE_OTHER): Payer: BLUE CROSS/BLUE SHIELD | Admitting: Internal Medicine

## 2017-10-06 ENCOUNTER — Other Ambulatory Visit: Payer: Self-pay | Admitting: Gastroenterology

## 2017-10-06 DIAGNOSIS — R1011 Right upper quadrant pain: Secondary | ICD-10-CM

## 2017-10-06 DIAGNOSIS — R079 Chest pain, unspecified: Secondary | ICD-10-CM

## 2017-10-09 ENCOUNTER — Other Ambulatory Visit: Payer: BLUE CROSS/BLUE SHIELD

## 2018-01-17 IMAGING — CT CT MAXILLOFACIAL W/O CM
3 of 4 series · 15 of 47 positions shown, 18 images · non-contrast
Comparison: None.

CLINICAL DATA: Sinus congestion.  Chronic intractable headache.

EXAM:
CT MAXILLOFACIAL WITHOUT CONTRAST
TECHNIQUE: Multidetector CT imaging of the maxillofacial structures was
performed. Multiplanar CT image reconstructions were also generated.
A small metallic BB was placed on the right temple in order to
reliably differentiate right from left.

[Series 3: axial sinus bone (burn to cd) · axial · 0.31mm/px · z∈[+87,+190]mm · 9 of 121 slices shown, 12 images]
[im 9/121  brain]
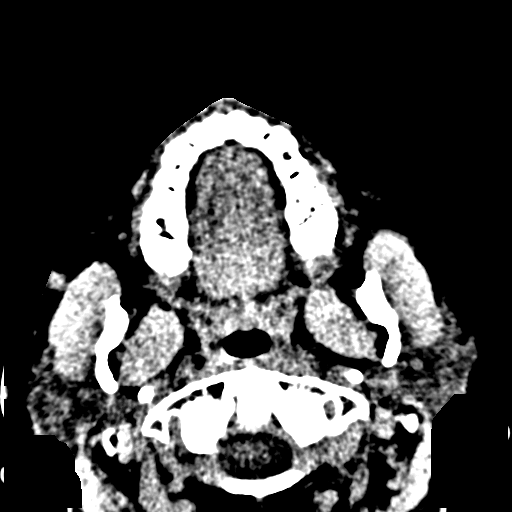
[im 9/121  bone]
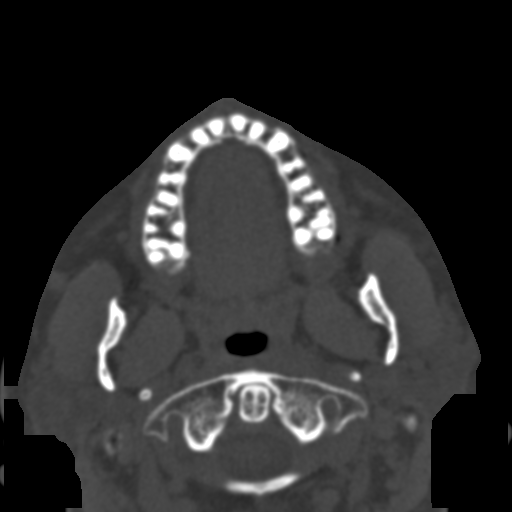
[im 26/121  bone]
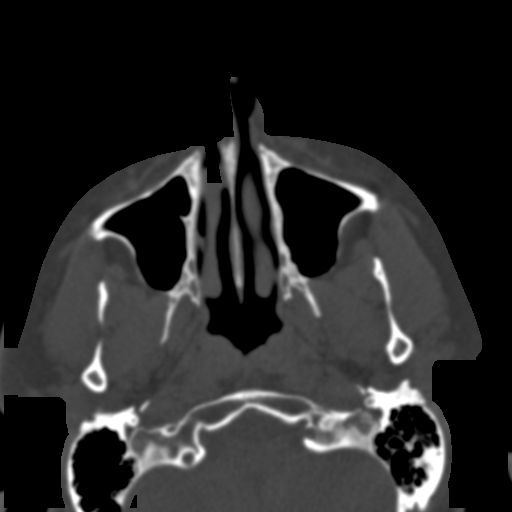
[im 35/121  bone]
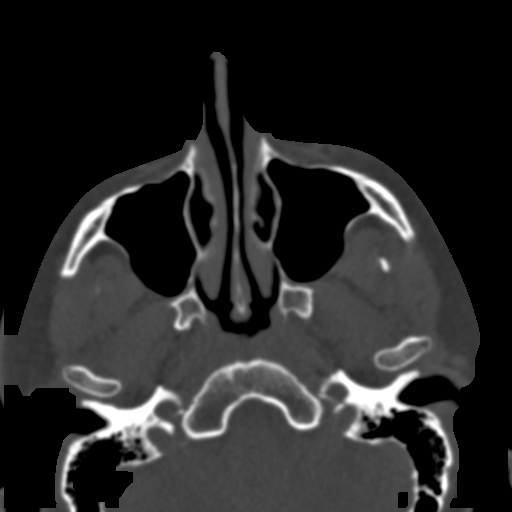
[im 52/121  bone]
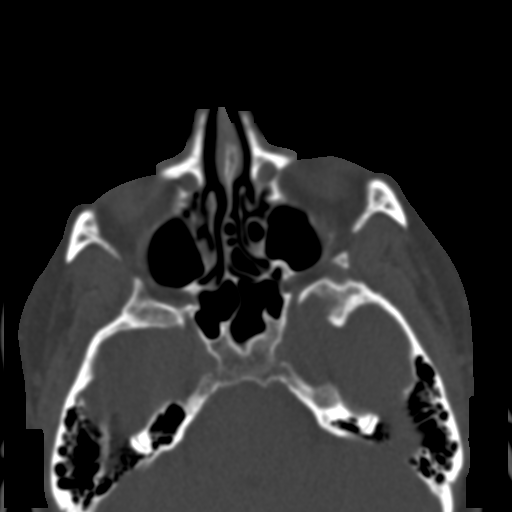
[im 61/121  brain]
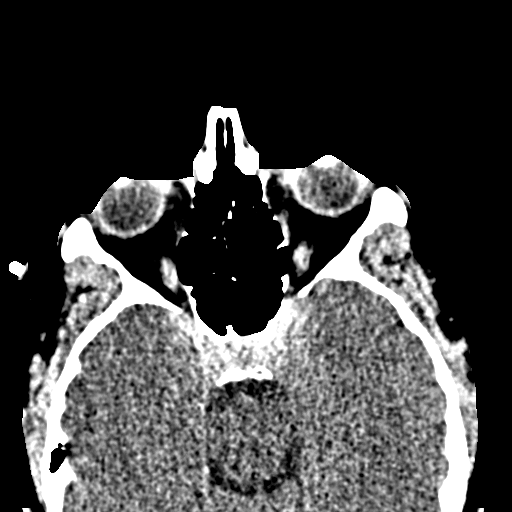
[im 61/121  bone]
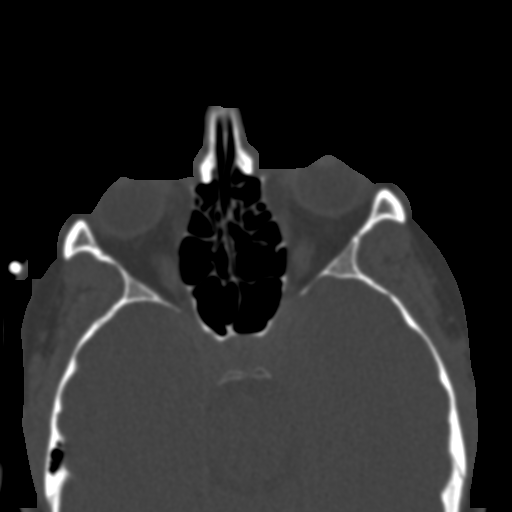
[im 69/121  bone]
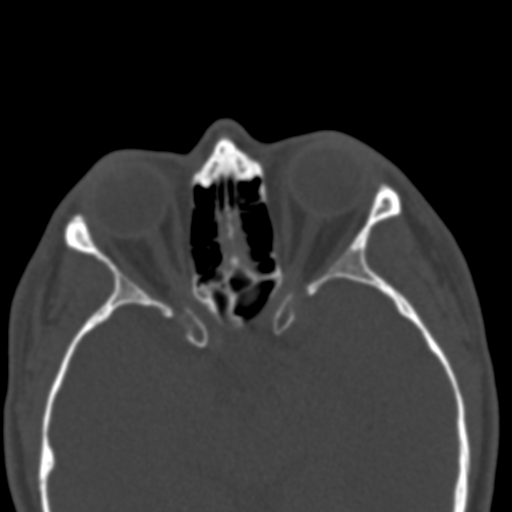
[im 86/121  bone]
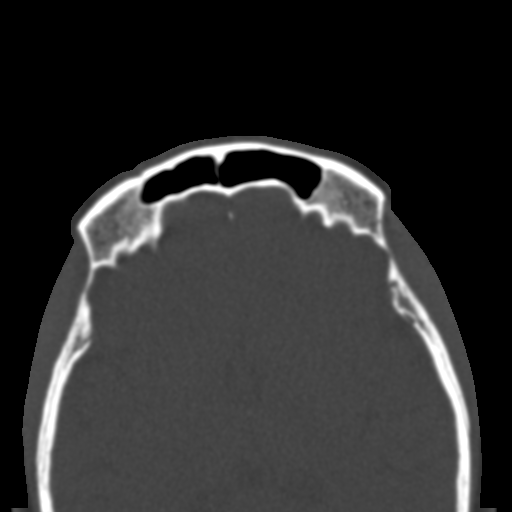
[im 95/121  bone]
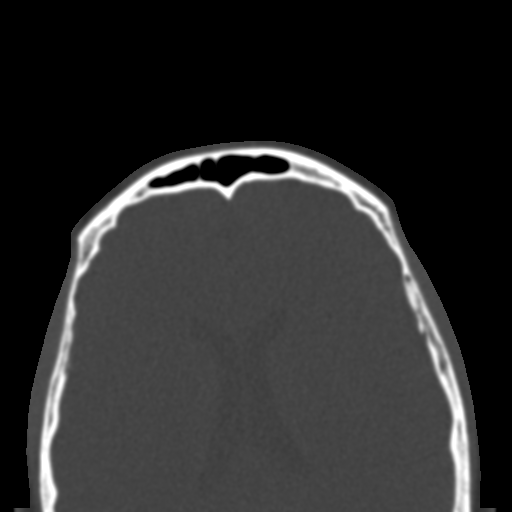
[im 112/121  brain]
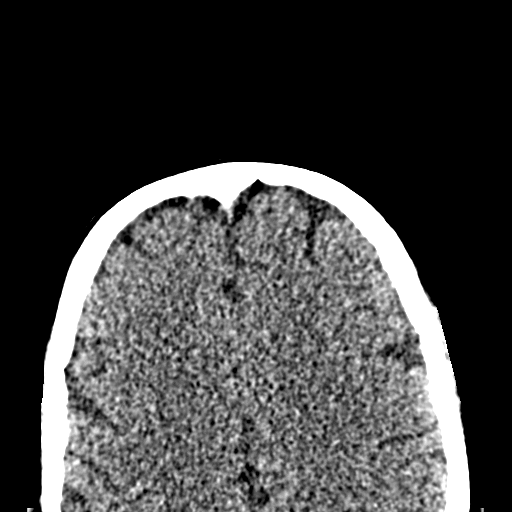
[im 112/121  bone]
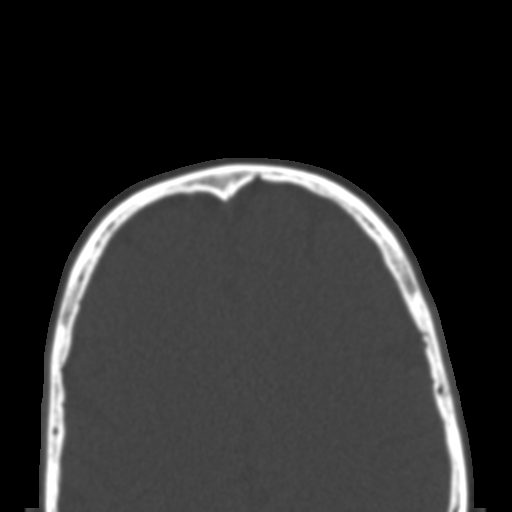

[Series 4: coronal sinus · coronal · 0.26mm/px · 3 of 81 slices shown]
[im 27/81  bone]
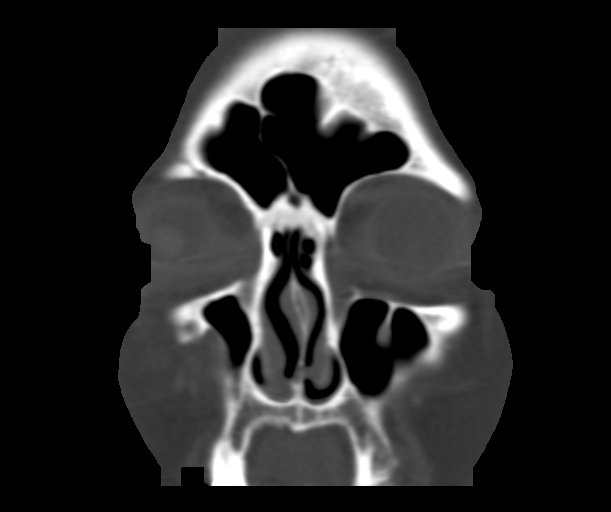
[im 36/81  bone]
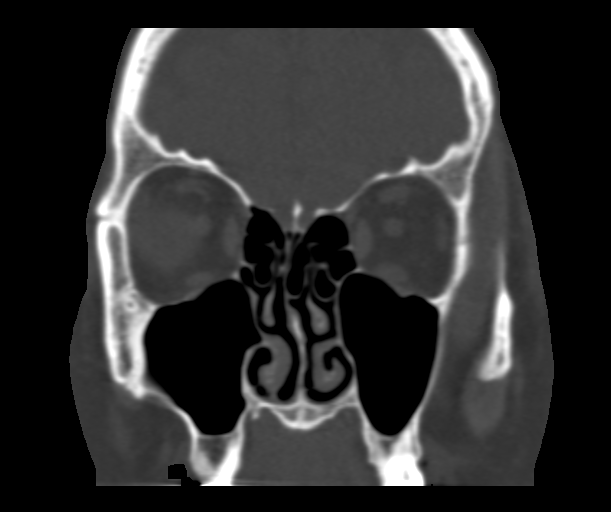
[im 45/81  bone]
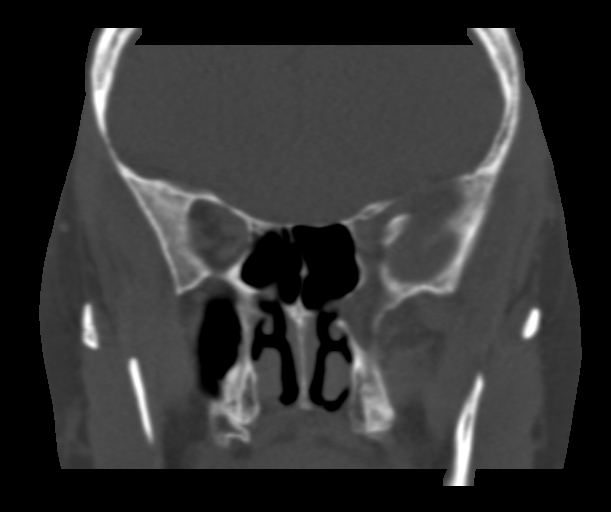

[Series 5: sagittal sinus · sagittal · 0.24mm/px · 3 of 80 slices shown]
[im 27/80  bone]
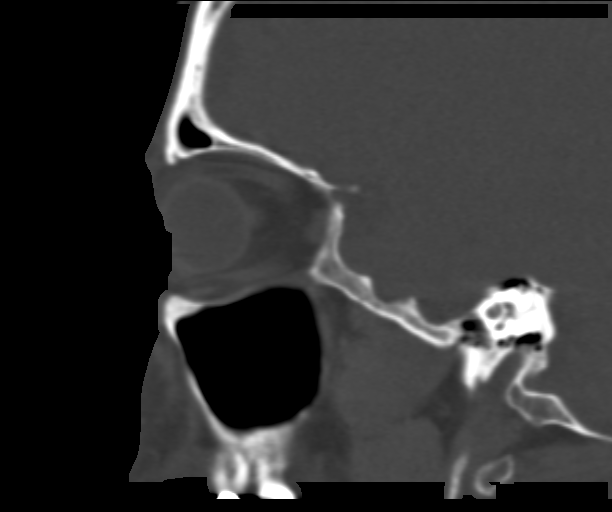
[im 40/80  bone]
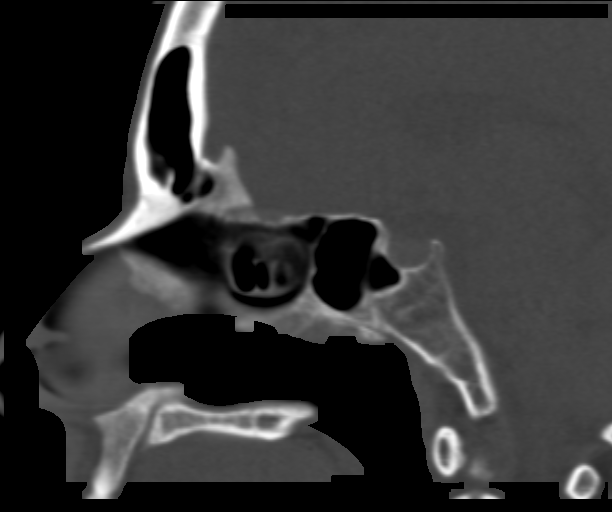
[im 53/80  bone]
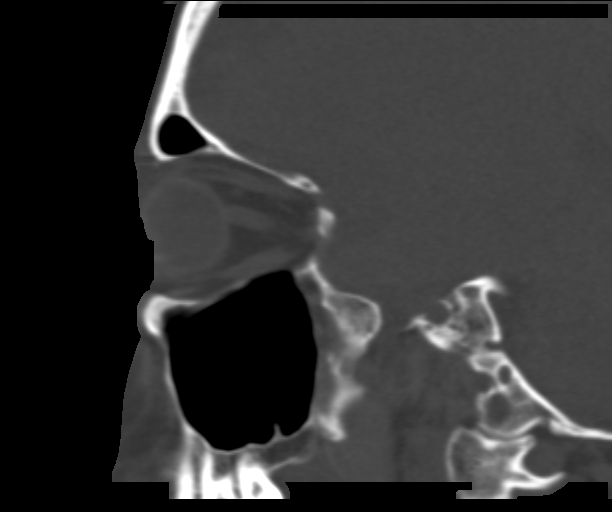

[15 of 47 positions shown; findings below may reference images not displayed]

FINDINGS: No acute sinusitis.  No mucosal thickening.

Patent bilateral maxillary infundibula. The right uncinate is more
vertical than the left with narrower infundibulum. Clear frontal
ethmoidal and sphenoid ethmoidal recesses. Minimal rotation of the
middle turbinates. No concha bullosa. No significant septal
deviation. No [place] or infraorbital air cell. Symmetric olfactory
recess depth. Intact medial orbital walls. Bone covered carotid and
optic canals. No incidental orbital or intracranial finding.
IMPRESSION: No active sinusitis or sinus obstruction.

## 2018-04-13 ENCOUNTER — Other Ambulatory Visit: Payer: Self-pay | Admitting: Gastroenterology

## 2018-04-13 DIAGNOSIS — R1013 Epigastric pain: Secondary | ICD-10-CM

## 2018-04-20 ENCOUNTER — Ambulatory Visit
Admission: RE | Admit: 2018-04-20 | Discharge: 2018-04-20 | Disposition: A | Discharge status: Home / Self Care | Payer: BLUE CROSS/BLUE SHIELD | Source: Ambulatory Visit | Attending: Gastroenterology | Admitting: Gastroenterology

## 2018-09-28 ENCOUNTER — Encounter (HOSPITAL_COMMUNITY): Payer: BLUE CROSS/BLUE SHIELD

## 2018-10-02 ENCOUNTER — Other Ambulatory Visit (HOSPITAL_COMMUNITY): Payer: Self-pay

## 2018-10-05 ENCOUNTER — Ambulatory Visit (HOSPITAL_COMMUNITY)
Admission: RE | Admit: 2018-10-05 | Discharge: 2018-10-05 | Disposition: A | Discharge status: Home / Self Care | Payer: BLUE CROSS/BLUE SHIELD | Source: Ambulatory Visit | Attending: Sports Medicine | Admitting: Sports Medicine

## 2018-10-05 DIAGNOSIS — M810 Age-related osteoporosis without current pathological fracture: Secondary | ICD-10-CM | POA: Insufficient documentation

## 2018-10-05 MED ORDER — ROMOSOZUMAB-AQQG 105 MG/1.17ML ~~LOC~~ SOSY
210.0000 mg | PREFILLED_SYRINGE | SUBCUTANEOUS | Status: DC
  Administered 2018-10-05: 210 mg via SUBCUTANEOUS
  Filled 2018-10-05: qty 2.4

## 2018-11-02 ENCOUNTER — Encounter (HOSPITAL_COMMUNITY)
Admission: RE | Admit: 2018-11-02 | Discharge: 2018-11-02 | Disposition: A | Discharge status: Home / Self Care | Payer: BLUE CROSS/BLUE SHIELD | Source: Ambulatory Visit | Attending: Sports Medicine | Admitting: Sports Medicine

## 2018-11-02 DIAGNOSIS — M810 Age-related osteoporosis without current pathological fracture: Secondary | ICD-10-CM | POA: Diagnosis present

## 2018-11-02 MED ORDER — ROMOSOZUMAB-AQQG 105 MG/1.17ML ~~LOC~~ SOSY
210.0000 mg | PREFILLED_SYRINGE | Freq: Once | SUBCUTANEOUS | Status: DC
  Administered 2018-11-02: 210 mg via SUBCUTANEOUS
  Filled 2018-11-02: qty 2.4

## 2018-11-30 ENCOUNTER — Ambulatory Visit (HOSPITAL_COMMUNITY)
Admission: RE | Admit: 2018-11-30 | Discharge: 2018-11-30 | Disposition: A | Discharge status: Home / Self Care | Payer: BLUE CROSS/BLUE SHIELD | Source: Ambulatory Visit | Attending: Sports Medicine | Admitting: Sports Medicine

## 2018-11-30 DIAGNOSIS — M810 Age-related osteoporosis without current pathological fracture: Secondary | ICD-10-CM | POA: Insufficient documentation

## 2018-11-30 MED ORDER — ROMOSOZUMAB-AQQG 105 MG/1.17ML ~~LOC~~ SOSY
210.0000 mg | PREFILLED_SYRINGE | Freq: Once | SUBCUTANEOUS | Status: AC
  Administered 2018-11-30: 210 mg via SUBCUTANEOUS
  Filled 2018-11-30: qty 2.4

## 2018-12-28 ENCOUNTER — Inpatient Hospital Stay (HOSPITAL_COMMUNITY): Admission: RE | Admit: 2018-12-28 | Payer: BLUE CROSS/BLUE SHIELD | Source: Ambulatory Visit

## 2019-01-25 ENCOUNTER — Encounter (HOSPITAL_COMMUNITY): Payer: BLUE CROSS/BLUE SHIELD

## 2019-01-28 ENCOUNTER — Ambulatory Visit (INDEPENDENT_AMBULATORY_CARE_PROVIDER_SITE_OTHER): Payer: BLUE CROSS/BLUE SHIELD | Admitting: Podiatry

## 2019-01-28 ENCOUNTER — Other Ambulatory Visit: Payer: Self-pay

## 2019-01-28 ENCOUNTER — Encounter: Payer: Self-pay | Admitting: Podiatry

## 2019-01-28 VITALS — Temp 97.3°F

## 2019-01-28 DIAGNOSIS — M2042 Other hammer toe(s) (acquired), left foot: Secondary | ICD-10-CM

## 2019-01-28 DIAGNOSIS — L84 Corns and callosities: Secondary | ICD-10-CM | POA: Diagnosis not present

## 2019-01-29 NOTE — Progress Notes (Signed)
Subjective:   Patient ID: Erin Jones, female   DOB: 58 y.o.   MRN: [id number]   HPI Patient's not been seen for several years and is concerned about a blister with the second digit left that is mildly tender   ROS      Objective:  Physical Exam  Neurovascular status intact with patient noted to have a deformed left second digit with pressure from the hallux occurring against the second toe with mild distal irritation of the second digit with small blister noted     Assessment:  Probability for trauma and structural deformity of the hallux second toe with irritation of the second digit     Plan:  H&P condition reviewed debrided the lesion sterile instrumentation advised on soaks applied padding and reappoint if symptoms were to recur

## 2019-09-22 ENCOUNTER — Other Ambulatory Visit (HOSPITAL_COMMUNITY): Payer: Self-pay | Admitting: *Deleted

## 2019-09-23 ENCOUNTER — Other Ambulatory Visit: Payer: Self-pay

## 2019-09-23 ENCOUNTER — Encounter (HOSPITAL_COMMUNITY)
Admission: RE | Admit: 2019-09-23 | Discharge: 2019-09-23 | Disposition: A | Discharge status: Home / Self Care | Payer: BC Managed Care – PPO | Source: Ambulatory Visit | Attending: Sports Medicine | Admitting: Sports Medicine

## 2019-09-23 DIAGNOSIS — M810 Age-related osteoporosis without current pathological fracture: Secondary | ICD-10-CM | POA: Insufficient documentation

## 2019-09-23 MED ORDER — ZOLEDRONIC ACID 5 MG/100ML IV SOLN: 5.0000 mg | Freq: Once | INTRAVENOUS | Status: AC

## 2019-09-23 MED ORDER — ZOLEDRONIC ACID 5 MG/100ML IV SOLN
INTRAVENOUS | Status: AC
  Administered 2019-09-23: 5 mg via INTRAVENOUS
  Filled 2019-09-23: qty 100

## 2019-09-23 NOTE — Discharge Instructions (Signed)

## 2019-10-06 ENCOUNTER — Ambulatory Visit: Payer: BLUE CROSS/BLUE SHIELD | Admitting: Podiatry

## 2019-10-20 ENCOUNTER — Ambulatory Visit: Payer: Self-pay | Admitting: Podiatry

## 2019-12-02 ENCOUNTER — Ambulatory Visit (INDEPENDENT_AMBULATORY_CARE_PROVIDER_SITE_OTHER): Payer: BC Managed Care – PPO | Admitting: Podiatry

## 2019-12-02 ENCOUNTER — Ambulatory Visit (INDEPENDENT_AMBULATORY_CARE_PROVIDER_SITE_OTHER): Payer: BC Managed Care – PPO

## 2019-12-02 ENCOUNTER — Other Ambulatory Visit: Payer: Self-pay | Admitting: Podiatry

## 2019-12-02 ENCOUNTER — Encounter: Payer: Self-pay | Admitting: Podiatry

## 2019-12-02 ENCOUNTER — Other Ambulatory Visit: Payer: Self-pay

## 2019-12-02 VITALS — Temp 97.2°F

## 2019-12-02 DIAGNOSIS — M779 Enthesopathy, unspecified: Secondary | ICD-10-CM

## 2019-12-02 DIAGNOSIS — M79672 Pain in left foot: Secondary | ICD-10-CM

## 2019-12-02 DIAGNOSIS — M778 Other enthesopathies, not elsewhere classified: Secondary | ICD-10-CM

## 2019-12-02 DIAGNOSIS — M7751 Other enthesopathy of right foot: Secondary | ICD-10-CM

## 2019-12-02 DIAGNOSIS — M7752 Other enthesopathy of left foot: Secondary | ICD-10-CM

## 2019-12-02 NOTE — Progress Notes (Signed)
4

## 2019-12-03 NOTE — Progress Notes (Signed)
Subjective:   Patient ID: Erin Jones, female   DOB: 59 y.o.   MRN: [id number]   HPI Patient presents stating that she is having a lot of pain in the left foot into the joints and has been going on for around 3 months but she is not been able to make it in because of Covid.  States it is very hard for her to walk neuro   ROS      Objective:  Physical Exam  Vascular status intact with patient's left third and fourth metatarsal showing inflammation fluid around the MPJs third and fourth left     Assessment:  Inflammatory capsulitis left forefoot     Plan:  H&P reviewed condition sterile prep done to the left forefoot and used utilize 60 mg like Marcaine mixture and I then aspirated the third and fourth MPJs getting out a small amount of clear fluid and injected quarter cc dexamethasone Kenalog in each joint along with thick plantar padding which was explained to patient.  Reappoint to recheck 2 weeks  X-rays indicate that the patient did not have any indication of fracture or arthritis associated with what she is experiencing

## 2019-12-16 ENCOUNTER — Encounter: Payer: Self-pay | Admitting: Podiatry

## 2019-12-16 ENCOUNTER — Ambulatory Visit (INDEPENDENT_AMBULATORY_CARE_PROVIDER_SITE_OTHER): Payer: BC Managed Care – PPO | Admitting: Podiatry

## 2019-12-16 ENCOUNTER — Ambulatory Visit: Payer: BC Managed Care – PPO | Admitting: Podiatry

## 2019-12-16 ENCOUNTER — Other Ambulatory Visit: Payer: Self-pay

## 2019-12-16 VITALS — Temp 97.2°F

## 2019-12-16 DIAGNOSIS — M2042 Other hammer toe(s) (acquired), left foot: Secondary | ICD-10-CM | POA: Diagnosis not present

## 2019-12-16 DIAGNOSIS — M779 Enthesopathy, unspecified: Secondary | ICD-10-CM | POA: Diagnosis not present

## 2019-12-16 NOTE — Progress Notes (Signed)
Subjective:   Patient ID: Erin Jones, female   DOB: 59 y.o.   MRN: [id number]   HPI Patient presents stating the pain has receded quite a bit but still present some and she had questions concerning orthotics and oral medications   ROS      Objective:  Physical Exam  Neurovascular status intact with diminishment of discomfort in the lesser MPJs left over right but prominent bone structure noted and flat     Assessment:  Inflammatory capsulitis lesser MPJs with prominent bone structure     Plan:  Reviewed her condition today discussed her orthotic which I reviewed and shoe gear that I want her to wear along with possible modifications and padding therapy and oral anti-inflammatories topicals as needed.  Patient will be seen back for [place] to recheck may require other treatment depending on response

## 2020-02-15 ENCOUNTER — Encounter (INDEPENDENT_AMBULATORY_CARE_PROVIDER_SITE_OTHER): Payer: Self-pay | Admitting: Otolaryngology

## 2020-02-15 ENCOUNTER — Other Ambulatory Visit: Payer: Self-pay

## 2020-02-15 ENCOUNTER — Ambulatory Visit (INDEPENDENT_AMBULATORY_CARE_PROVIDER_SITE_OTHER): Payer: BC Managed Care – PPO | Admitting: Otolaryngology

## 2020-02-15 VITALS — Temp 97.9°F

## 2020-02-15 DIAGNOSIS — H6123 Impacted cerumen, bilateral: Secondary | ICD-10-CM | POA: Diagnosis not present

## 2020-02-15 NOTE — Progress Notes (Signed)
HPI: Erin Jones is a 59 y.o. female who presents for evaluation of ear complaints.  Cachet describes itching in the left ear and the ear feels a little full.  She is on medication for rheumatoid arthritis and started a new medication recently.  She also just received her Covid vaccine x2 from [facility].  She has had some intermittent chills since the vaccine.  But no documented fevers. She does have history of allergies and rhinitis and also needed a refill of her Singulair that she takes.  She inquires about any treatment for itching in the left ear.  No past medical history on file. No past surgical history on file. Social History   Socioeconomic History  . Marital status: Married    Spouse name: Not on file  . Number of children: Not on file  . Years of education: Not on file  . Highest education level: Not on file  Occupational History  . Not on file  Tobacco Use  . Smoking status: Never Smoker  . Smokeless tobacco: Never Used  Substance and Sexual Activity  . Alcohol use: No    Alcohol/week: 0.0 standard drinks  . Drug use: No  . Sexual activity: Not on file  Other Topics Concern  . Not on file  Social History Narrative  . Not on file   Social Determinants of Health   Financial Resource Strain:   . Difficulty of Paying Living Expenses:   Food Insecurity:   . Worried About Programme researcher, broadcasting/film/video in the Last Year:   . Barista in the Last Year:   Transportation Needs:   . Freight forwarder (Medical):   Marland Kitchen Lack of Transportation (Non-Medical):   Physical Activity:   . Days of Exercise per Week:   . Minutes of Exercise per Session:   Stress:   . Feeling of Stress :   Social Connections:   . Frequency of Communication with Friends and Family:   . Frequency of Social Gatherings with Friends and Family:   . Attends Religious Services:   . Active Member of Clubs or Organizations:   . Attends Banker Meetings:   Marland Kitchen Marital Status:    No family history  on file. Allergies  Allergen Reactions  . Sulfa Antibiotics   . Other Rash    Neosporin   Prior to Admission medications   Medication Sig Start Date End Date Taking? Authorizing Provider  cycloSPORINE (RESTASIS) 0.05 % ophthalmic emulsion USE 1 DROP IN BOTH EYES TWICE A DAY 08/08/19  Yes [provider]  Eszopiclone 3 MG TABS TAKE 1 TABLET BY MOUTH AT BEDTIME *STOP ATIVAN* 10/05/19  Yes [provider]  Methotrexate Sodium (METHOTREXATE PO) Take by mouth.   Yes [provider]  valACYclovir (VALTREX) 500 MG tablet Take by mouth. 07/27/19  Yes [provider]     Positive ROS: Otherwise negative  All other systems have been reviewed and were otherwise negative with the exception of those mentioned in the HPI and as above.  Physical Exam: Constitutional: Alert, well-appearing, no acute distress Ears: External ears without lesions or tenderness.  She has a little bit of wax buildup in her ears that was removed with curette and forceps from use of previous Q-tips but she has minimal wax buildup.  Both TMs are clear with good mobility on pneumatic otoscopy with no middle ear abnormality noted.  The itching in the left ear is more the lateral portion of the ear and this  is clear. Nasal: External nose without lesions. Septum midline with mild rhinitis.. Clear nasal passages Oral: Lips and gums without lesions. Tongue and palate mucosa without lesions. Posterior oropharynx clear. Neck: No palpable adenopathy or masses Respiratory: Breathing comfortably  Skin: No facial/neck lesions or rash noted.  Cerumen impaction removal  Date/Time: 02/15/2020 6:00 PM Performed by: Rozetta Nunnery, MD Authorized by: Rozetta Nunnery, MD   Consent:    Consent obtained:  Verbal   Consent given by:  Patient   Risks discussed:  Pain and bleeding Procedure details:    Location:  L ear and R ear   Procedure type: curette and forceps   Post-procedure details:     Inspection:  TM intact and canal normal   Hearing quality:  Improved   Patient tolerance of procedure:  Tolerated well, no immediate complications Comments:     TMs are clear bilaterally.    Assessment: Minimal wax buildup with clear canals and clear TMs bilaterally. Mild rhinitis  Plan: Refilled her Singulair with 90 and refills. I also prescribed Diprolene 0.05% cream to use twice daily as needed itching. Recommended not using Q-tips in her ear canals. She will follow-up as needed.  Radene Journey, MD

## 2020-03-11 ENCOUNTER — Encounter (HOSPITAL_COMMUNITY): Payer: Self-pay

## 2020-03-11 ENCOUNTER — Inpatient Hospital Stay (HOSPITAL_COMMUNITY)
Admission: EM | Admit: 2020-03-11 | Discharge: 2020-03-15 | DRG: 392 | Disposition: A | Discharge status: Home / Self Care | Payer: BC Managed Care – PPO | Attending: Internal Medicine | Admitting: Internal Medicine

## 2020-03-11 ENCOUNTER — Other Ambulatory Visit: Payer: Self-pay

## 2020-03-11 DIAGNOSIS — M779 Enthesopathy, unspecified: Secondary | ICD-10-CM

## 2020-03-11 DIAGNOSIS — Z8349 Family history of other endocrine, nutritional and metabolic diseases: Secondary | ICD-10-CM

## 2020-03-11 DIAGNOSIS — Z79899 Other long term (current) drug therapy: Secondary | ICD-10-CM

## 2020-03-11 DIAGNOSIS — M069 Rheumatoid arthritis, unspecified: Secondary | ICD-10-CM | POA: Diagnosis present

## 2020-03-11 DIAGNOSIS — G4700 Insomnia, unspecified: Secondary | ICD-10-CM | POA: Diagnosis present

## 2020-03-11 DIAGNOSIS — K5792 Diverticulitis of intestine, part unspecified, without perforation or abscess without bleeding: Secondary | ICD-10-CM | POA: Diagnosis not present

## 2020-03-11 DIAGNOSIS — Z803 Family history of malignant neoplasm of breast: Secondary | ICD-10-CM

## 2020-03-11 DIAGNOSIS — K5720 Diverticulitis of large intestine with perforation and abscess without bleeding: Secondary | ICD-10-CM | POA: Diagnosis not present

## 2020-03-11 DIAGNOSIS — R300 Dysuria: Secondary | ICD-10-CM | POA: Diagnosis present

## 2020-03-11 DIAGNOSIS — Z20822 Contact with and (suspected) exposure to covid-19: Secondary | ICD-10-CM | POA: Diagnosis present

## 2020-03-11 DIAGNOSIS — Z882 Allergy status to sulfonamides status: Secondary | ICD-10-CM

## 2020-03-11 DIAGNOSIS — Z87442 Personal history of urinary calculi: Secondary | ICD-10-CM

## 2020-03-11 DIAGNOSIS — K5909 Other constipation: Secondary | ICD-10-CM | POA: Diagnosis present

## 2020-03-11 HISTORY — DX: Unspecified osteoarthritis, unspecified site: M19.90

## 2020-03-11 HISTORY — DX: Other specified disorders of bone density and structure, unspecified site: M85.80

## 2020-03-11 HISTORY — DX: Disorder of kidney and ureter, unspecified: N28.9

## 2020-03-11 HISTORY — DX: Other specified disorders of bone, unspecified site: M89.8X9

## 2020-03-11 MED ORDER — ACETAMINOPHEN 500 MG PO TABS
1000.0000 mg | ORAL_TABLET | Freq: Once | ORAL | Status: AC
  Administered 2020-03-11: 1000 mg via ORAL
  Filled 2020-03-11: qty 2

## 2020-03-11 NOTE — ED Triage Notes (Signed)
Pt reports lower abdominal pain apprx 1700 today after her sister visited with her today. Pt describes pain as cramping.  Pt then began developing body chills. Pt reports nausea without emesis or diarrhea.

## 2020-03-12 ENCOUNTER — Encounter (HOSPITAL_COMMUNITY): Payer: Self-pay | Admitting: Internal Medicine

## 2020-03-12 ENCOUNTER — Emergency Department (HOSPITAL_COMMUNITY): Payer: BC Managed Care – PPO

## 2020-03-12 DIAGNOSIS — Z20822 Contact with and (suspected) exposure to covid-19: Secondary | ICD-10-CM | POA: Diagnosis present

## 2020-03-12 DIAGNOSIS — K5720 Diverticulitis of large intestine with perforation and abscess without bleeding: Principal | ICD-10-CM

## 2020-03-12 DIAGNOSIS — K5792 Diverticulitis of intestine, part unspecified, without perforation or abscess without bleeding: Secondary | ICD-10-CM | POA: Diagnosis present

## 2020-03-12 DIAGNOSIS — Z803 Family history of malignant neoplasm of breast: Secondary | ICD-10-CM | POA: Diagnosis not present

## 2020-03-12 DIAGNOSIS — K5909 Other constipation: Secondary | ICD-10-CM | POA: Diagnosis present

## 2020-03-12 DIAGNOSIS — R300 Dysuria: Secondary | ICD-10-CM | POA: Diagnosis present

## 2020-03-12 DIAGNOSIS — Z882 Allergy status to sulfonamides status: Secondary | ICD-10-CM | POA: Diagnosis not present

## 2020-03-12 DIAGNOSIS — M069 Rheumatoid arthritis, unspecified: Secondary | ICD-10-CM | POA: Diagnosis present

## 2020-03-12 DIAGNOSIS — Z87442 Personal history of urinary calculi: Secondary | ICD-10-CM | POA: Diagnosis not present

## 2020-03-12 DIAGNOSIS — Z79899 Other long term (current) drug therapy: Secondary | ICD-10-CM | POA: Diagnosis not present

## 2020-03-12 DIAGNOSIS — G4700 Insomnia, unspecified: Secondary | ICD-10-CM | POA: Diagnosis present

## 2020-03-12 DIAGNOSIS — Z8349 Family history of other endocrine, nutritional and metabolic diseases: Secondary | ICD-10-CM | POA: Diagnosis not present

## 2020-03-12 LAB — CBC WITH DIFFERENTIAL/PLATELET
Abs Immature Granulocytes: 0.07 10*3/uL (ref 0.00–0.07)
Abs Immature Granulocytes: 0.07 10*3/uL (ref 0.00–0.07)
Basophils Absolute: 0 10*3/uL (ref 0.0–0.1)
Basophils Absolute: 0 10*3/uL (ref 0.0–0.1)
Basophils Relative: 0 %
Basophils Relative: 0 %
Eosinophils Absolute: 0 10*3/uL (ref 0.0–0.5)
Eosinophils Absolute: 0 10*3/uL (ref 0.0–0.5)
Eosinophils Relative: 0 %
Eosinophils Relative: 0 %
HCT: 39 % (ref 36.0–46.0)
HCT: 35.1 % — ABNORMAL LOW (ref 36.0–46.0)
Hemoglobin: 13.1 g/dL (ref 12.0–15.0)
Hemoglobin: 11.8 g/dL — ABNORMAL LOW (ref 12.0–15.0)
Immature Granulocytes: 0 %
Immature Granulocytes: 0 %
Lymphocytes Relative: 5 %
Lymphocytes Relative: 11 %
Lymphs Abs: 0.9 10*3/uL (ref 0.7–4.0)
Lymphs Abs: 1.8 10*3/uL (ref 0.7–4.0)
MCH: 33.2 pg (ref 26.0–34.0)
MCH: 33.3 pg (ref 26.0–34.0)
MCHC: 33.6 g/dL (ref 30.0–36.0)
MCHC: 33.6 g/dL (ref 30.0–36.0)
MCV: 99 fL (ref 80.0–100.0)
MCV: 99.2 fL (ref 80.0–100.0)
Monocytes Absolute: 1.1 10*3/uL — ABNORMAL HIGH (ref 0.1–1.0)
Monocytes Absolute: 1 10*3/uL (ref 0.1–1.0)
Monocytes Relative: 7 %
Monocytes Relative: 6 %
Neutro Abs: 14.5 10*3/uL — ABNORMAL HIGH (ref 1.7–7.7)
Neutro Abs: 12.9 10*3/uL — ABNORMAL HIGH (ref 1.7–7.7)
Neutrophils Relative %: 88 %
Neutrophils Relative %: 83 %
Platelets: 278 10*3/uL (ref 150–400)
Platelets: 244 10*3/uL (ref 150–400)
RBC: 3.94 MIL/uL (ref 3.87–5.11)
RBC: 3.54 MIL/uL — ABNORMAL LOW (ref 3.87–5.11)
RDW: 13.8 % (ref 11.5–15.5)
RDW: 14 % (ref 11.5–15.5)
WBC: 16.6 10*3/uL — ABNORMAL HIGH (ref 4.0–10.5)
WBC: 15.8 10*3/uL — ABNORMAL HIGH (ref 4.0–10.5)
nRBC: 0 % (ref 0.0–0.2)
nRBC: 0 % (ref 0.0–0.2)

## 2020-03-12 LAB — COMPREHENSIVE METABOLIC PANEL
ALT: 24 U/L (ref 0–44)
AST: 26 U/L (ref 15–41)
Albumin: 4.8 g/dL (ref 3.5–5.0)
Alkaline Phosphatase: 46 U/L (ref 38–126)
Anion gap: 9 (ref 5–15)
BUN: 22 mg/dL — ABNORMAL HIGH (ref 6–20)
CO2: 25 mmol/L (ref 22–32)
Calcium: 9.4 mg/dL (ref 8.9–10.3)
Chloride: 103 mmol/L (ref 98–111)
Creatinine, Ser: 0.63 mg/dL (ref 0.44–1.00)
GFR calc Af Amer: >60 mL/min (ref >60)
GFR calc non Af Amer: >60 mL/min (ref >60)
Glucose, Bld: 136 mg/dL — ABNORMAL HIGH (ref 70–99)
Potassium: 3.5 mmol/L (ref 3.5–5.1)
Sodium: 137 mmol/L (ref 135–145)
Total Bilirubin: 1 mg/dL (ref 0.3–1.2)
Total Protein: 7.2 g/dL (ref 6.5–8.1)

## 2020-03-12 LAB — URINALYSIS, ROUTINE W REFLEX MICROSCOPIC
Bacteria, UA: NONE SEEN
Bilirubin Urine: NEGATIVE
Glucose, UA: NEGATIVE mg/dL
Ketones, ur: NEGATIVE mg/dL
Leukocytes,Ua: NEGATIVE
Nitrite: NEGATIVE
Protein, ur: NEGATIVE mg/dL
Specific Gravity, Urine: 1.004 — ABNORMAL LOW (ref 1.005–1.030)
pH: 7 (ref 5.0–8.0)

## 2020-03-12 LAB — PROTIME-INR
INR: 1 (ref 0.8–1.2)
Prothrombin Time: 12.7 seconds (ref 11.4–15.2)

## 2020-03-12 LAB — POC URINE PREG, ED: Preg Test, Ur: NEGATIVE

## 2020-03-12 LAB — SARS CORONAVIRUS 2 BY RT PCR (HOSPITAL ORDER, PERFORMED IN ~~LOC~~ HOSPITAL LAB): SARS Coronavirus 2: NEGATIVE

## 2020-03-12 LAB — HIV ANTIBODY (ROUTINE TESTING W REFLEX): HIV Screen 4th Generation wRfx: NONREACTIVE

## 2020-03-12 LAB — LACTIC ACID, PLASMA: Lactic Acid, Venous: 1.7 mmol/L (ref 0.5–1.9)

## 2020-03-12 MED ORDER — POTASSIUM CHLORIDE IN NACL 20-0.9 MEQ/L-% IV SOLN
INTRAVENOUS | Status: DC
  Filled 2020-03-12: qty 1000

## 2020-03-12 MED ORDER — HYDROMORPHONE HCL 1 MG/ML IJ SOLN
0.5000 mg | INTRAMUSCULAR | Status: DC | PRN
  Administered 2020-03-12 – 2020-03-14 (×5): 0.5 mg via INTRAVENOUS
  Filled 2020-03-12 (×2): qty 0.5
  Filled 2020-03-12: qty 1
  Filled 2020-03-12 (×2): qty 0.5

## 2020-03-12 MED ORDER — IOHEXOL 300 MG/ML  SOLN
100.0000 mL | Freq: Once | INTRAMUSCULAR | Status: AC | PRN
  Administered 2020-03-12: 100 mL via INTRAVENOUS

## 2020-03-12 MED ORDER — ONDANSETRON HCL 4 MG/2ML IJ SOLN
4.0000 mg | Freq: Once | INTRAMUSCULAR | Status: AC
  Administered 2020-03-12: 4 mg via INTRAVENOUS
  Filled 2020-03-12: qty 2

## 2020-03-12 MED ORDER — MORPHINE SULFATE (PF) 4 MG/ML IV SOLN
4.0000 mg | Freq: Once | INTRAVENOUS | Status: AC
  Administered 2020-03-12: 4 mg via INTRAVENOUS
  Filled 2020-03-12: qty 1

## 2020-03-12 MED ORDER — ENOXAPARIN SODIUM 40 MG/0.4ML ~~LOC~~ SOLN
40.0000 mg | SUBCUTANEOUS | Status: DC
  Administered 2020-03-12 – 2020-03-15 (×4): 40 mg via SUBCUTANEOUS
  Filled 2020-03-12 (×5): qty 0.4

## 2020-03-12 MED ORDER — PIPERACILLIN-TAZOBACTAM 3.375 G IVPB
3.3750 g | Freq: Three times a day (TID) | INTRAVENOUS | Status: DC
  Administered 2020-03-12 – 2020-03-15 (×9): 3.375 g via INTRAVENOUS
  Filled 2020-03-12 (×9): qty 50

## 2020-03-12 MED ORDER — SODIUM CHLORIDE 0.9 % IV BOLUS (SEPSIS)
1000.0000 mL | Freq: Once | INTRAVENOUS | Status: AC
  Administered 2020-03-12: 1000 mL via INTRAVENOUS

## 2020-03-12 MED ORDER — MELATONIN 3 MG PO TABS
9.0000 mg | ORAL_TABLET | Freq: Every day | ORAL | Status: DC
  Administered 2020-03-12: 9 mg via ORAL
  Filled 2020-03-12: qty 3

## 2020-03-12 MED ORDER — ACETAMINOPHEN 650 MG RE SUPP: 650.0000 mg | Freq: Four times a day (QID) | RECTAL | Status: DC | PRN

## 2020-03-12 MED ORDER — PIPERACILLIN-TAZOBACTAM 3.375 G IVPB 30 MIN
3.3750 g | Freq: Once | INTRAVENOUS | Status: AC
  Administered 2020-03-12: 3.375 g via INTRAVENOUS
  Filled 2020-03-12: qty 50

## 2020-03-12 MED ORDER — CYCLOSPORINE 0.05 % OP EMUL
1.0000 [drp] | Freq: Two times a day (BID) | OPHTHALMIC | Status: DC
  Administered 2020-03-13 – 2020-03-15 (×4): 1 [drp] via OPHTHALMIC
  Filled 2020-03-12 (×9): qty 1

## 2020-03-12 MED ORDER — MELATONIN 5 MG PO TABS
10.0000 mg | ORAL_TABLET | Freq: Every day | ORAL | Status: DC
  Filled 2020-03-12: qty 2

## 2020-03-12 MED ORDER — ONDANSETRON HCL 4 MG/2ML IJ SOLN
4.0000 mg | Freq: Four times a day (QID) | INTRAMUSCULAR | Status: DC | PRN
  Administered 2020-03-12 – 2020-03-14 (×5): 4 mg via INTRAVENOUS
  Filled 2020-03-12 (×5): qty 2

## 2020-03-12 MED ORDER — ONDANSETRON HCL 4 MG PO TABS: 4.0000 mg | ORAL_TABLET | Freq: Four times a day (QID) | ORAL | Status: DC | PRN

## 2020-03-12 MED ORDER — ACETAMINOPHEN 325 MG PO TABS: 650.0000 mg | ORAL_TABLET | Freq: Four times a day (QID) | ORAL | Status: DC | PRN

## 2020-03-12 NOTE — Consult Note (Signed)
[facility] Consult  Reason for Consult: Perforated diverticulitis  Referring Physician:  Dr. Dina Rich (ED), Dr. Carles Collet (Hospitalist)  Chief Complaint    Abdominal Pain      HPI: Erin Jones is a 59 y.o. female with a history of RA on methotrexate and upadacitinib who comes in with acute onset of LLQ pain and pelvic pain that started yesterday. The pain was sharp and constant in nature. She had some nausea without vomiting. She was found to have fever to 103 in the ED.  She was hemodynamically stable. She reports feeling off and not well for a few months leading up to this and having chills. She attributed this to her [place] vaccination.    She was evaluated in the Ed and found to have perforated sigmoid diverticulitis with air tracking along the ovarian vessels on the left. She has never had diverticulitis prior but did have a colonoscopy around 8 years ago, and this demonstrated diverticula.  She has chronic constipation. She denies any blood per rectum but does have some darker stools at times. She says she takes some probiotic and has a small BM daily but this can still be hard and minimal, and she will use mag citrate when needed.     Past Medical History:  Diagnosis Date  . Arthritis    RA  . Bone loss    medication related bone loss   . Renal disorder    kidney stone    Past Surgical History:  Procedure Laterality Date  . BUNIONECTOMY    . COLPOSCOPY      Family History  Problem Relation Age of Onset  . Breast cancer Maternal Aunt   . Thyroid disease Mother     Social History   Tobacco Use  . Smoking status: Never Smoker  . Smokeless tobacco: Never Used  Substance Use Topics  . Alcohol use: No    Alcohol/week: 0.0 standard drinks  . Drug use: No    Medications:  Current Facility-Administered Medications  Medication Dose Route Frequency Provider Last Rate Last Admin  . 0.9 % NaCl with KCl 20 mEq/ L  infusion   Intravenous Continuous Tat,  David, MD 100 mL/hr at 03/12/20 1044 New Bag at 03/12/20 1044  . acetaminophen (TYLENOL) tablet 650 mg  650 mg Oral Q6H PRN Tat, David, MD       Or  . acetaminophen (TYLENOL) suppository 650 mg  650 mg Rectal Q6H PRN Tat, Shanon Brow, MD      . cycloSPORINE (RESTASIS) 0.05 % ophthalmic emulsion 1 drop  1 drop Both Eyes BID Tat, David, MD      . enoxaparin (LOVENOX) injection 40 mg  40 mg Subcutaneous Q24H Tat, Shanon Brow, MD   40 mg at 03/12/20 1045  . HYDROmorphone (DILAUDID) injection 0.5 mg  0.5 mg Intravenous Q3H PRN Tat, Shanon Brow, MD   0.5 mg at 03/12/20 1042  . ondansetron (ZOFRAN) tablet 4 mg  4 mg Oral Q6H PRN Tat, David, MD       Or  . ondansetron (ZOFRAN) injection 4 mg  4 mg Intravenous Q6H PRN Tat, Shanon Brow, MD      . piperacillin-tazobactam (ZOSYN) IVPB 3.375 g  3.375 g Intravenous Q8H Tat, David, MD      . triamcinolone acetonide (KENALOG) 10 MG/ML injection 10 mg  10 mg Other Once Wallene Huh, DPM       Current Outpatient Medications  Medication Sig Dispense Refill Last Dose  . cycloSPORINE (RESTASIS) 0.05 % ophthalmic  emulsion USE 1 DROP IN BOTH EYES TWICE A DAY     . Eszopiclone 3 MG TABS TAKE 1 TABLET BY MOUTH AT BEDTIME *STOP ATIVAN*     . Methotrexate Sodium (METHOTREXATE PO) Take by mouth.     . valACYclovir (VALTREX) 500 MG tablet Take by mouth.       Allergies  Allergen Reactions  . Sulfa Antibiotics   . Other Rash    Neosporin     ROS:  A comprehensive review of systems was negative except for: Constitutional: positive for chills and fevers Gastrointestinal: positive for abdominal pain, constipation and nausea  Blood pressure (!) 145/73, pulse 85, temperature 98.3 F (36.8 C), temperature source Oral, resp. rate 13, height 5\' 4"  (1.626 m), weight 62.1 kg, SpO2 98 %. Physical Exam Vitals reviewed.  Constitutional:      Appearance: She is normal weight.  HENT:     Head: Normocephalic and atraumatic.  Eyes:     Extraocular Movements: Extraocular movements intact.   Cardiovascular:     Rate and Rhythm: Normal rate and regular rhythm.  Pulmonary:     Effort: Pulmonary effort is normal.     Breath sounds: Normal breath sounds.  Abdominal:     General: There is no distension.     Palpations: Abdomen is soft.     Tenderness: There is abdominal tenderness in the suprapubic area and left lower quadrant.  Musculoskeletal:     Comments: Moves all extremities, no edema   Skin:    General: Skin is warm and dry.  Neurological:     General: No focal deficit present.     Mental Status: She is alert and oriented to person, place, and time.  Psychiatric:        Mood and Affect: Mood normal.        Behavior: Behavior normal.     Results: Results for orders placed or performed during the hospital encounter of 03/11/20 (from the past 48 hour(s))  Urinalysis, Routine w reflex microscopic     Status: Abnormal   Collection Time: 03/12/20 12:03 AM  Result Value Ref Range   Color, Urine COLORLESS (A) YELLOW   APPearance CLEAR CLEAR   Specific Gravity, Urine 1.004 (L) 1.005 - 1.030   pH 7.0 5.0 - 8.0   Glucose, UA NEGATIVE NEGATIVE mg/dL   Hgb urine dipstick SMALL (A) NEGATIVE   Bilirubin Urine NEGATIVE NEGATIVE   Ketones, ur NEGATIVE NEGATIVE mg/dL   Protein, ur NEGATIVE NEGATIVE mg/dL   Nitrite NEGATIVE NEGATIVE   Leukocytes,Ua NEGATIVE NEGATIVE   WBC, UA 0-5 0 - 5 WBC/hpf   Bacteria, UA NONE SEEN NONE SEEN   Squamous Epithelial / LPF 0-5 0 - 5    Comment: Performed at [facility], [address], [place], [place] [address]  Lactic acid, plasma     Status: None   Collection Time: 03/12/20 12:23 AM  Result Value Ref Range   Lactic Acid, Venous 1.7 0.5 - 1.9 mmol/L    Comment: Performed at [facility], [address] [place], [place] [address]  Comprehensive metabolic panel     Status: Abnormal   Collection Time: 03/12/20 12:23 AM  Result Value Ref Range   Sodium 137 135 - 145 mmol/L   Potassium 3.5 3.5 - 5.1 mmol/L   Chloride 103 98 - 111 mmol/L    CO2 25 22 - 32 mmol/L   Glucose, Bld 136 (H) 70 - 99 mg/dL    Comment: Glucose reference range applies only  to samples taken after fasting for at least 8 hours.   BUN 22 (H) 6 - 20 mg/dL   Creatinine, Ser 8.33 0.44 - 1.00 mg/dL   Calcium 9.4 8.9 - 82.5 mg/dL   Total Protein 7.2 6.5 - 8.1 g/dL   Albumin 4.8 3.5 - 5.0 g/dL   AST 26 15 - 41 U/L   ALT 24 0 - 44 U/L   Alkaline Phosphatase 46 38 - 126 U/L   Total Bilirubin 1.0 0.3 - 1.2 mg/dL   GFR calc non Af Amer >60 >60 mL/min   GFR calc Af Amer >60 >60 mL/min   Anion gap 9 5 - 15    Comment: Performed at [facility], [address] [place], [place] [address]  CBC WITH DIFFERENTIAL     Status: Abnormal   Collection Time: 03/12/20 12:23 AM  Result Value Ref Range   WBC 16.6 (H) 4.0 - 10.5 K/uL   RBC 3.94 3.87 - 5.11 MIL/uL   Hemoglobin 13.1 12.0 - 15.0 g/dL   HCT 67.3 41.9 - 37.9 %   MCV 99.0 80.0 - 100.0 fL   MCH 33.2 26.0 - 34.0 pg   MCHC 33.6 30.0 - 36.0 g/dL   RDW 02.4 09.7 - 35.3 %   Platelets 278 150 - 400 K/uL   nRBC 0.0 0.0 - 0.2 %   Neutrophils Relative % 88 %   Neutro Abs 14.5 (H) 1.7 - 7.7 K/uL   Lymphocytes Relative 5 %   Lymphs Abs 0.9 0.7 - 4.0 K/uL   Monocytes Relative 7 %   Monocytes Absolute 1.1 (H) 0.1 - 1.0 K/uL   Eosinophils Relative 0 %   Eosinophils Absolute 0.0 0.0 - 0.5 K/uL   Basophils Relative 0 %   Basophils Absolute 0.0 0.0 - 0.1 K/uL   Immature Granulocytes 0 %   Abs Immature Granulocytes 0.07 0.00 - 0.07 K/uL    Comment: Performed at [facility], [address], [place], [place] [address]  Protime-INR     Status: None   Collection Time: 03/12/20 12:23 AM  Result Value Ref Range   Prothrombin Time 12.7 11.4 - 15.2 seconds   INR 1.0 0.8 - 1.2    Comment: (NOTE) INR goal varies based on device and disease states. Performed at [facility], [address], [place], [place] [address]   Blood Culture (routine x 2)     Status: None (Preliminary result)   Collection Time: 03/12/20 12:24 AM    Specimen: Right Antecubital; Blood  Result Value Ref Range   Specimen Description RIGHT ANTECUBITAL    Special Requests      BOTTLES DRAWN AEROBIC AND ANAEROBIC Blood Culture adequate volume Performed at [facility], [address] [place], [place] [address]    Culture PENDING    Report Status PENDING   Blood Culture (routine x 2)     Status: None (Preliminary result)   Collection Time: 03/12/20 12:34 AM   Specimen: Left Antecubital; Blood  Result Value Ref Range   Specimen Description LEFT ANTECUBITAL    Special Requests      BOTTLES DRAWN AEROBIC AND ANAEROBIC Blood Culture adequate volume Performed at [facility], [address] [place], [place] [address]    Culture PENDING    Report Status PENDING   POC urine preg, ED     Status: None   Collection Time: 03/12/20  1:25 AM  Result Value Ref Range   Preg Test, Ur NEGATIVE NEGATIVE    Comment:  THE SENSITIVITY OF THIS METHODOLOGY IS >24 mIU/mL   SARS Coronavirus 2 by RT PCR (hospital order, performed in [facility] hospital lab) Nasopharyngeal Nasopharyngeal Swab     Status: None   Collection Time: 03/12/20  3:22 AM   Specimen: Nasopharyngeal Swab  Result Value Ref Range   SARS Coronavirus 2 NEGATIVE NEGATIVE    Comment: (NOTE) SARS-CoV-2 target nucleic acids are NOT DETECTED. The SARS-CoV-2 RNA is generally detectable in upper and lower respiratory specimens during the acute phase of infection. The lowest concentration of SARS-CoV-2 viral copies this assay can detect is 250 copies / mL. A negative result does not preclude SARS-CoV-2 infection and should not be used as the sole basis for treatment or other patient management decisions.  A negative result may occur with improper specimen collection / handling, submission of specimen other than nasopharyngeal swab, presence of viral mutation(s) within the areas targeted by this assay, and inadequate number of viral copies (<250 copies / mL). A negative result must be  combined with clinical observations, patient history, and epidemiological information. Fact Sheet for Patients:   BoilerBrush.com.cy Fact Sheet for Healthcare Providers: https://pope.com/ This test is not yet approved or cleared  by the [place] FDA and has been authorized for detection and/or diagnosis of SARS-CoV-2 by FDA under an Emergency Use Authorization (EUA).  This EUA will remain in effect (meaning this test can be used) for the duration of the COVID-19 declaration under Section 564(b)(1) of the Act, 21 U.S.C. section 360bbb-3(b)(1), unless the authorization is terminated or revoked sooner. Performed at [facility], [address], [place], [place] [address]   CBC with Differential/Platelet     Status: Abnormal   Collection Time: 03/12/20  9:29 AM  Result Value Ref Range   WBC 15.8 (H) 4.0 - 10.5 K/uL   RBC 3.54 (L) 3.87 - 5.11 MIL/uL   Hemoglobin 11.8 (L) 12.0 - 15.0 g/dL   HCT 82.9 (L) 56.2 - 13.0 %   MCV 99.2 80.0 - 100.0 fL   MCH 33.3 26.0 - 34.0 pg   MCHC 33.6 30.0 - 36.0 g/dL   RDW 86.5 78.4 - 69.6 %   Platelets 244 150 - 400 K/uL   nRBC 0.0 0.0 - 0.2 %   Neutrophils Relative % 83 %   Neutro Abs 12.9 (H) 1.7 - 7.7 K/uL   Lymphocytes Relative 11 %   Lymphs Abs 1.8 0.7 - 4.0 K/uL   Monocytes Relative 6 %   Monocytes Absolute 1.0 0.1 - 1.0 K/uL   Eosinophils Relative 0 %   Eosinophils Absolute 0.0 0.0 - 0.5 K/uL   Basophils Relative 0 %   Basophils Absolute 0.0 0.0 - 0.1 K/uL   Immature Granulocytes 0 %   Abs Immature Granulocytes 0.07 0.00 - 0.07 K/uL    Comment: Performed at [facility], [address] [place], [place] [address]   Personally reviewed- sigmoid colon thickened and air tracking vessels, no fluid colon  CT ABDOMEN PELVIS W CONTRAST  Result Date: 03/12/2020 CLINICAL DATA:  Lower abdominal pain.  Nausea and chills. EXAM: CT ABDOMEN AND PELVIS WITH CONTRAST TECHNIQUE: Multidetector CT imaging of  the abdomen and pelvis was performed using the standard protocol following bolus administration of intravenous contrast. CONTRAST:  OMNIPAQUE IOHEXOL 300 MG/ML  SOLN COMPARISON:  None. FINDINGS: Lower chest: Minor hypoventilatory atelectasis. No pleural fluid or confluent airspace disease. Hepatobiliary: No focal liver abnormality is seen. No gallstones, gallbladder wall thickening, or biliary dilatation. Pancreas: No ductal dilatation  or inflammation. Spleen: Normal in size without focal abnormality. Adrenals/Urinary Tract: Normal adrenal glands. Mild prominence of both renal pelvises without frank hydronephrosis. Homogeneous renal enhancement with symmetric excretion on delayed phase imaging. No evidence of focal renal lesion. Urinary bladder is nondistended. No bladder wall thickening. Stomach/Bowel: Inflamed sigmoid diverticulum, series 2, image 64, with pericolonic edema and fat stranding consistent with acute diverticulitis. No evidence of abscess, however there is extraluminal air that extends into the adjacent retroperitoneum and tracks cranially along the gonadal vessels, largest pocket seen series 2, image 45. Multiple additional noninflamed colonic diverticula. Nondistended stomach. Few scattered fluid-filled small bowel loops in the pelvis are likely reactive. No obstruction. Appendix not confidently visualized, no evidence of appendicitis. Vascular/Lymphatic: Normal caliber abdominal aorta. The portal vein is patent. No evidence of portal or mesenteric gas. No venous thrombus. No adenopathy. Reproductive: Prominent left periuterine and adnexal vascularity. No adnexal mass. Uterus is otherwise unremarkable. Other: Extraluminal air tracks into the left retroperitoneum along the left gonadal vessels. No focal fluid collection. No ascites. Musculoskeletal: Mild L2 superior endplate compression fracture, likely chronic. Grade 1 anterolisthesis of L4 on L5 is facet mediated. Disc calcification at  T11-T12. IMPRESSION: 1. Acute sigmoid diverticulitis. Unusual pattern of perforation with air tracking in the left retroperitoneum along the ovarian veins, without associated pneumoperitoneum. No abscess. 2. Prominent left periuterine and adnexal vascularity, can be seen with pelvic congestion syndrome in the appropriate clinical setting, or alternatively may be reactive. 3. Mild L2 superior endplate compression fracture, likely chronic. These results were called by telephone at the time of interpretation on 03/12/2020 at 3:15 am to Dr Ross Marcus , who verbally acknowledged these results. Electronically Signed   By: Narda Rutherford M.D.   On: 03/12/2020 03:15     Assessment & Plan:  Valaria Kohut is a 59 y.o. female with perforated diverticulitis with gas extraluminal and no abscess to drain. She is on methotrexate and upadacitinib for RA.  Leukocytosis trending down with antibiotics, pain improving some.   -Clear diet as tolerated  -IV zosyn for diverticulitis  -Outpatient referral to GI for colonoscopy due to perforated diverticulitis and time from prior ~8 years   -We have discussed that diverticulitis is a spectrum of disease. We discussed that it ranges from simple diverticulitis that can be treated with oral antibiotics as an outpatient to severe cases with perforations that require emergency surgery and colostomy.  We discussed that there case is slightly more complicated because of the perforation with gas outside the bowel.  We have discussed that some of the complicated cases require admission to the hospital for IV antibiotics and otherwise still require Interventional Radiology drainage of an abscess. We have discussed that during the acute inflammation and infection period that it is more likely to need an colostomy due to the risk of the colon anastomosis not healing and result in a leak.  We discussed better surgical outcomes and higher chance of a anastomosis if we are able to get the  patient 4-6 weeks past the acute infection and inflammatory period.  We discussed that even still with an anastomosis a diverting ileostomy may be performed if there is any concern.  Discussed that we hope to avoid surgery right now and discuss her options as an outpatient after a repeat colonoscopy given the extent of time since her prior colonoscopy.   All questions were answered to the satisfaction of the patient.  Discussed with Dr. Arbutus Leas.   Lucretia Roers 03/12/2020, 12:14 PM

## 2020-03-12 NOTE — ED Provider Notes (Addendum)
[facility] EMERGENCY DEPARTMENT Provider Note   CSN: [id number] Arrival date & time: 03/11/20  2151     History Chief Complaint  Patient presents with  . Abdominal Pain    Erin Jones is a 59 y.o. female.  HPI     This is a 59 year old female with a history of rheumatoid arthritis on methotrexate who presents with abdominal pain.  Patient reports onset of abdominal pain at 5 PM yesterday.  She reports lower abdominal pain that was sharp at times and nonradiating.  She had associated nausea without vomiting or diarrhea.  She states that she had intense chills but has recently had frequent chills after receiving her Covid vaccination in March.  Patient denies any back pain or flank pain.  Denies any dysuria, frequency, urgency.  Currently she rates her pain at 5 out of 10.  She states it is less intense than it was initially.  However, at home she was doubled over in pain.  Reports colonoscopy with diverticuli but no history of diverticulitis.  Past Medical History:  Diagnosis Date  . Arthritis    RA  . Renal disorder    kidney stone    Patient Active Problem List   Diagnosis Date Noted  . Rheumatoid arthritis ([place]) 12/08/2014    History reviewed. No pertinent surgical history.   OB History   No obstetric history on file.     No family history on file.  Social History   Tobacco Use  . Smoking status: Never Smoker  . Smokeless tobacco: Never Used  Substance Use Topics  . Alcohol use: No    Alcohol/week: 0.0 standard drinks  . Drug use: No    Home Medications Prior to Admission medications   Medication Sig Start Date End Date Taking? Authorizing Provider  cycloSPORINE (RESTASIS) 0.05 % ophthalmic emulsion USE 1 DROP IN BOTH EYES TWICE A DAY 08/08/19   [provider]  Eszopiclone 3 MG TABS TAKE 1 TABLET BY MOUTH AT BEDTIME *STOP ATIVAN* 10/05/19   [provider]  Methotrexate Sodium (METHOTREXATE PO) Take by mouth.    [provider]    valACYclovir (VALTREX) 500 MG tablet Take by mouth. 07/27/19   [provider]    Allergies    Sulfa antibiotics and Other  Review of Systems   Review of Systems  Constitutional: Positive for chills and fever.  Respiratory: Negative for shortness of breath.   Cardiovascular: Negative for chest pain.  Gastrointestinal: Positive for abdominal pain and nausea. Negative for diarrhea and vomiting.  Genitourinary: Negative for dysuria and flank pain.  All other systems reviewed and are negative.   Physical Exam Updated Vital Signs BP (!) 146/73   Pulse 86   Temp (!) 103.1 F (39.5 C) (Oral)   Resp 20   Ht 1.626 m (5\' 4" )   Wt 62.1 kg   SpO2 99%   BMI 23.52 kg/m   Physical Exam Vitals and nursing note reviewed.  Constitutional:      Appearance: She is well-developed. She is not ill-appearing.  HENT:     Head: Normocephalic and atraumatic.     Mouth/Throat:     Mouth: Mucous membranes are moist.  Eyes:     Pupils: Pupils are equal, round, and reactive to light.  Cardiovascular:     Rate and Rhythm: Normal rate and regular rhythm.     Heart sounds: Normal heart sounds.  Pulmonary:     Effort: Pulmonary effort is normal. No respiratory distress.  Breath sounds: No wheezing.  Abdominal:     General: Bowel sounds are normal.     Palpations: Abdomen is soft.     Tenderness: There is abdominal tenderness in the suprapubic area and left lower quadrant. There is no guarding or rebound.  Musculoskeletal:     Cervical back: Neck supple.  Skin:    General: Skin is warm and dry.  Neurological:     Mental Status: She is alert and oriented to person, place, and time.  Psychiatric:        Mood and Affect: Mood normal.     ED Results / Procedures / Treatments   Labs (all labs ordered are listed, but only abnormal results are displayed) Labs Reviewed  COMPREHENSIVE METABOLIC PANEL - Abnormal; Notable for the following components:      Result Value   Glucose, Bld  136 (*)    BUN 22 (*)    All other components within normal limits  CBC WITH DIFFERENTIAL/PLATELET - Abnormal; Notable for the following components:   WBC 16.6 (*)    Neutro Abs 14.5 (*)    Monocytes Absolute 1.1 (*)    All other components within normal limits  URINALYSIS, ROUTINE W REFLEX MICROSCOPIC - Abnormal; Notable for the following components:   Color, Urine COLORLESS (*)    Specific Gravity, Urine 1.004 (*)    Hgb urine dipstick SMALL (*)    All other components within normal limits  CULTURE, BLOOD (ROUTINE X 2)  CULTURE, BLOOD (ROUTINE X 2)  URINE CULTURE  SARS CORONAVIRUS 2 BY RT PCR (HOSPITAL ORDER, PERFORMED IN [facility])  LACTIC ACID, PLASMA  PROTIME-INR  POC URINE PREG, ED    EKG EKG Interpretation  Date/Time:  Sunday March 12 2020 00:38:13 EDT Ventricular Rate:  98 PR Interval:    QRS Duration: 103 QT Interval:  357 QTC Calculation: 456 R Axis:   83 Text Interpretation: Sinus tachycardia Ventricular premature complex Confirmed by Ross Marcus ([id number]) on 03/12/2020 3:34:08 AM   Radiology CT ABDOMEN PELVIS W CONTRAST  Result Date: 03/12/2020 CLINICAL DATA:  Lower abdominal pain.  Nausea and chills. EXAM: CT ABDOMEN AND PELVIS WITH CONTRAST TECHNIQUE: Multidetector CT imaging of the abdomen and pelvis was performed using the standard protocol following bolus administration of intravenous contrast. CONTRAST:  OMNIPAQUE IOHEXOL 300 MG/ML  SOLN COMPARISON:  None. FINDINGS: Lower chest: Minor hypoventilatory atelectasis. No pleural fluid or confluent airspace disease. Hepatobiliary: No focal liver abnormality is seen. No gallstones, gallbladder wall thickening, or biliary dilatation. Pancreas: No ductal dilatation or inflammation. Spleen: Normal in size without focal abnormality. Adrenals/Urinary Tract: Normal adrenal glands. Mild prominence of both renal pelvises without frank hydronephrosis. Homogeneous renal enhancement with symmetric excretion  on delayed phase imaging. No evidence of focal renal lesion. Urinary bladder is nondistended. No bladder wall thickening. Stomach/Bowel: Inflamed sigmoid diverticulum, series 2, image 64, with pericolonic edema and fat stranding consistent with acute diverticulitis. No evidence of abscess, however there is extraluminal air that extends into the adjacent retroperitoneum and tracks cranially along the gonadal vessels, largest pocket seen series 2, image 45. Multiple additional noninflamed colonic diverticula. Nondistended stomach. Few scattered fluid-filled small bowel loops in the pelvis are likely reactive. No obstruction. Appendix not confidently visualized, no evidence of appendicitis. Vascular/Lymphatic: Normal caliber abdominal aorta. The portal vein is patent. No evidence of portal or mesenteric gas. No venous thrombus. No adenopathy. Reproductive: Prominent left periuterine and adnexal vascularity. No adnexal mass. Uterus is otherwise unremarkable. Other: Extraluminal  air tracks into the left retroperitoneum along the left gonadal vessels. No focal fluid collection. No ascites. Musculoskeletal: Mild L2 superior endplate compression fracture, likely chronic. Grade 1 anterolisthesis of L4 on L5 is facet mediated. Disc calcification at T11-T12. IMPRESSION: 1. Acute sigmoid diverticulitis. Unusual pattern of perforation with air tracking in the left retroperitoneum along the ovarian veins, without associated pneumoperitoneum. No abscess. 2. Prominent left periuterine and adnexal vascularity, can be seen with pelvic congestion syndrome in the appropriate clinical setting, or alternatively may be reactive. 3. Mild L2 superior endplate compression fracture, likely chronic. These results were called by telephone at the time of interpretation on 03/12/2020 at 3:15 am to Dr Ross Marcus , who verbally acknowledged these results. Electronically Signed   By: Narda Rutherford M.D.   On: 03/12/2020 03:15     Procedures Procedures (including critical care time)  CRITICAL CARE Performed by: Shon Baton   Total critical care time: 45 minutes  Critical care time was exclusive of separately billable procedures and treating other patients.  Critical care was necessary to treat or prevent imminent or life-threatening deterioration.  Critical care was time spent personally by me on the following activities: development of treatment plan with patient and/or surrogate as well as nursing, discussions with consultants, evaluation of patient's response to treatment, examination of patient, obtaining history from patient or surrogate, ordering and performing treatments and interventions, ordering and review of laboratory studies, ordering and review of radiographic studies, pulse oximetry and re-evaluation of patient's condition.   Medications Ordered in ED Medications  piperacillin-tazobactam (ZOSYN) IVPB 3.375 g (has no administration in time range)  acetaminophen (TYLENOL) tablet 1,000 mg (1,000 mg Oral Given 03/11/20 2246)  sodium chloride 0.9 % bolus 1,000 mL (0 mLs Intravenous Stopped 03/12/20 0200)    And  sodium chloride 0.9 % bolus 1,000 mL (0 mLs Intravenous Stopped 03/12/20 0200)  morphine 4 MG/ML injection 4 mg (4 mg Intravenous Given 03/12/20 0026)  ondansetron (ZOFRAN) injection 4 mg (4 mg Intravenous Given 03/12/20 0025)  iohexol (OMNIPAQUE) 300 MG/ML solution 100 mL (100 mLs Intravenous Contrast Given 03/12/20 0241)    ED Course  I have reviewed the triage vital signs and the nursing notes.  Pertinent labs & imaging results that were available during my care of the patient were reviewed by me and considered in my medical decision making (see chart for details).    MDM Rules/Calculators/A&P                       Patient presents with abdominal pain.  Noted to be febrile to 103.1.  She is otherwise nontoxic and vital signs are otherwise reassuring.  She is not hypotensive.  Sepsis  work-up was initiated.  Considerations include but not limited to intra-abdominal process such as appendicitis or diverticulitis, UTI.  Patient is at high risk because she is immunosuppressed.  EKG shows sinus tachycardia.  Lactate is normal but patient has a leukocytosis to 16.  Urinalysis without evidence of UTI.  Otherwise no significant metabolic derangements.  CT scan of the abdomen obtained to rule out intra-abdominal process.  Received a call from radiology.  Patient with evidence of diverticulitis and likely a microperforation.  This does track retroperitoneally which is odd.  Patient was given Zosyn.  On recheck, she is comfortable appearing.  Patient was updated with plan of care.  Covid testing sent and pending.  3:49 AM Spoke with Dr. Henreitta Leber, general surgery.  Agrees with Zosyn and close  monitoring.  Final Clinical Impression(s) / ED Diagnoses Final diagnoses:  Diverticulitis of large intestine with perforation without abscess or bleeding    Rx / DC Orders ED Discharge Orders    None       Qunicy Higinbotham, Mayer Masker, MD 03/12/20 2426    Shon Baton, MD 03/12/20 [phone]

## 2020-03-12 NOTE — H&P (Signed)
History and Physical  Erin Jones DSK:[id number] DOB: [birth year] DOA: 03/11/2020   PCP: [facility]   Patient coming from: Home  Chief Complaint: abd pain  HPI:  Erin Jones is a 59 y.o. female with medical history of rheumatoid arthritis on methotrexate and upadacitinib presenting with 1 day history of lower abdominal pain that began on the evening of 03/11/2020.  The patient had a small bowel movement afterward without any relief.  She described her pain as constant moderate to severe in intensity.  She has not had any pain this severe before this episode.  The patient had been in her usual state of health prior to the onset of the abdominal pain.  She had denied any recent chest pain, shortness breath, coughing, hemoptysis, vomiting, diarrhea, hematochezia, melena.  Should some dysuria but denies any hematuria.  She has had some nausea without emesis.  The patient denies any new medications recently.  She has had some subjective chills. In the emergency department, the patient was febrile to 103.1 F, but she was hemodynamically stable with oxygen saturation 100% room air.  BMP, LFTs were unremarkable.  WBC 16.6 with hemoglobin 13.1 and platelets 270,000.  UA was negative for pyuria.  CT of the abdomen and pelvis showed an inflamed sigmoid colon diverticulum with pericolonic edema and stranding.  There was extraluminal air adjacent to the retroperitoneum.  The patient was started on IV Zosyn.  General surgery, Dr. Henreitta Leber was consulted.  Assessment/Plan: Acute diverticulitis with perforation -03/11/2020 CT abdomen/pelvis as discussed above -Clear liquid diet for now -General surgery consult -Continue IV Zosyn -Start IV fluids -Judicious pain control -UA--neg for pyuria  Rheumatoid arthritis -Last methotrexate dose on 03/09/2020 -Hold upadacitinib       Past Medical History:  Diagnosis Date  . Arthritis    RA  . Renal disorder    kidney stone   History reviewed. No  pertinent surgical history. Social History:  reports that she has never smoked. She has never used smokeless tobacco. She reports that she does not drink alcohol or use drugs.   Family history: reviewed.  No pertinent history  Allergies  Allergen Reactions  . Sulfa Antibiotics   . Other Rash    Neosporin     Prior to Admission medications   Medication Sig Start Date End Date Taking? Authorizing Provider  cycloSPORINE (RESTASIS) 0.05 % ophthalmic emulsion USE 1 DROP IN BOTH EYES TWICE A DAY 08/08/19   [provider]  Eszopiclone 3 MG TABS TAKE 1 TABLET BY MOUTH AT BEDTIME *STOP ATIVAN* 10/05/19   [provider]  Methotrexate Sodium (METHOTREXATE PO) Take by mouth.    [provider]  valACYclovir (VALTREX) 500 MG tablet Take by mouth. 07/27/19   [provider]    Review of Systems:  Constitutional:  No weight loss, night sweats, Fevers, chills, fatigue.  Head&Eyes: No headache.  No vision loss.  No eye pain or scotoma ENT:  No Difficulty swallowing,Tooth/dental problems,Sore throat,  No ear ache, post nasal drip,  Cardio-vascular:  No chest pain, Orthopnea, PND, swelling in lower extremities,  dizziness, palpitations  GI:  No  vomiting, diarrhea, loss of appetite, hematochezia, melena, heartburn, indigestion, Resp:  No shortness of breath with exertion or at rest. No cough. No coughing up of blood .No wheezing.No chest wall deformity  Skin:  no rash or lesions.  GU:  no dysuria, change in color of urine, no urgency or frequency. No flank pain.  Musculoskeletal:  No joint pain or swelling. No decreased range of motion. No back pain.  Psych:  No change in mood or affect. No depression or anxiety. Neurologic: No headache, no dysesthesia, no focal weakness, no vision loss. No syncope  Physical Exam: Vitals:   03/12/20 0500 03/12/20 0530 03/12/20 0600 03/12/20 0630  BP: (!) 142/65 130/62 125/62 (!) 123/59  Pulse: 88 86  84  Resp: 15  19 16 16   Temp:      TempSrc:      SpO2: 94% 96%  97%  Weight:      Height:       General:  A&O x 3, NAD, nontoxic, pleasant/cooperative Head/Eye: No conjunctival hemorrhage, no icterus, [place]/AT, No nystagmus ENT:  No icterus,  No thrush, good dentition, no pharyngeal exudate Neck:  No masses, no lymphadenpathy, no bruits CV:  RRR, no rub, no gallop, no S3 Lung:  CTAB, good air movement, no wheeze, no rhonchi Abdomen: soft/lower abd pain, +BS, nondistended, no peritoneal signs Ext: No cyanosis, No rashes, No petechiae, No lymphangitis, No edema Neuro: CNII-XII intact, strength 4/5 in bilateral upper and lower extremities, no dysmetria  Labs on Admission:  Basic Metabolic Panel: Recent Labs  Lab 03/12/20 0023  NA 137  K 3.5  CL 103  CO2 25  GLUCOSE 136*  BUN 22*  CREATININE 0.63  CALCIUM 9.4   Liver Function Tests: Recent Labs  Lab 03/12/20 0023  AST 26  ALT 24  ALKPHOS 46  BILITOT 1.0  PROT 7.2  ALBUMIN 4.8   No results for input(s): LIPASE, AMYLASE in the last 168 hours. No results for input(s): AMMONIA in the last 168 hours. CBC: Recent Labs  Lab 03/12/20 0023  WBC 16.6*  NEUTROABS 14.5*  HGB 13.1  HCT 39.0  MCV 99.0  PLT 278   Coagulation Profile: Recent Labs  Lab 03/12/20 0023  INR 1.0   Cardiac Enzymes: No results for input(s): CKTOTAL, CKMB, CKMBINDEX, TROPONINI in the last 168 hours. BNP: Invalid input(s): POCBNP CBG: No results for input(s): GLUCAP in the last 168 hours. Urine analysis:    Component Value Date/Time   COLORURINE COLORLESS (A) 03/12/2020 0003   APPEARANCEUR CLEAR 03/12/2020 0003   LABSPEC 1.004 (L) 03/12/2020 0003   PHURINE 7.0 03/12/2020 0003   GLUCOSEU NEGATIVE 03/12/2020 0003   HGBUR SMALL (A) 03/12/2020 0003   BILIRUBINUR NEGATIVE 03/12/2020 0003   KETONESUR NEGATIVE 03/12/2020 0003   PROTEINUR NEGATIVE 03/12/2020 0003   NITRITE NEGATIVE 03/12/2020 0003   LEUKOCYTESUR NEGATIVE 03/12/2020 0003   Sepsis  Labs: @LABRCNTIP (procalcitonin:4,lacticidven:4) ) Recent Results (from the past 240 hour(s))  Blood Culture (routine x 2)     Status: None (Preliminary result)   Collection Time: 03/12/20 12:24 AM   Specimen: Right Antecubital; Blood  Result Value Ref Range Status   Specimen Description RIGHT ANTECUBITAL  Final   Special Requests   Final    BOTTLES DRAWN AEROBIC AND ANAEROBIC Blood Culture adequate volume Performed at [facility], [address] [place], [address] [place]    Culture PENDING  Incomplete   Report Status PENDING  Incomplete  Blood Culture (routine x 2)     Status: None (Preliminary result)   Collection Time: 03/12/20 12:34 AM   Specimen: Left Antecubital; Blood  Result Value Ref Range Status   Specimen Description LEFT ANTECUBITAL  Final   Special Requests   Final    BOTTLES DRAWN AEROBIC AND ANAEROBIC Blood Culture adequate volume Performed at [facility], [address] [place],  [place] [address]    Culture PENDING  Incomplete   Report Status PENDING  Incomplete  SARS Coronavirus 2 by RT PCR (hospital order, performed in [facility] hospital lab) Nasopharyngeal Nasopharyngeal Swab     Status: None   Collection Time: 03/12/20  3:22 AM   Specimen: Nasopharyngeal Swab  Result Value Ref Range Status   SARS Coronavirus 2 NEGATIVE NEGATIVE Final    Comment: (NOTE) SARS-CoV-2 target nucleic acids are NOT DETECTED. The SARS-CoV-2 RNA is generally detectable in upper and lower respiratory specimens during the acute phase of infection. The lowest concentration of SARS-CoV-2 viral copies this assay can detect is 250 copies / mL. A negative result does not preclude SARS-CoV-2 infection and should not be used as the sole basis for treatment or other patient management decisions.  A negative result may occur with improper specimen collection / handling, submission of specimen other than nasopharyngeal swab, presence of viral mutation(s) within the areas targeted by this  assay, and inadequate number of viral copies (<250 copies / mL). A negative result must be combined with clinical observations, patient history, and epidemiological information. Fact Sheet for Patients:   BoilerBrush.com.cy Fact Sheet for Healthcare Providers: https://pope.com/ This test is not yet approved or cleared  by the [place] FDA and has been authorized for detection and/or diagnosis of SARS-CoV-2 by FDA under an Emergency Use Authorization (EUA).  This EUA will remain in effect (meaning this test can be used) for the duration of the COVID-19 declaration under Section 564(b)(1) of the Act, 21 U.S.C. section 360bbb-3(b)(1), unless the authorization is terminated or revoked sooner. Performed at [facility], [address], [place], [place] [address]      Radiological Exams on Admission: CT ABDOMEN PELVIS W CONTRAST  Result Date: 03/12/2020 CLINICAL DATA:  Lower abdominal pain.  Nausea and chills. EXAM: CT ABDOMEN AND PELVIS WITH CONTRAST TECHNIQUE: Multidetector CT imaging of the abdomen and pelvis was performed using the standard protocol following bolus administration of intravenous contrast. CONTRAST:  OMNIPAQUE IOHEXOL 300 MG/ML  SOLN COMPARISON:  None. FINDINGS: Lower chest: Minor hypoventilatory atelectasis. No pleural fluid or confluent airspace disease. Hepatobiliary: No focal liver abnormality is seen. No gallstones, gallbladder wall thickening, or biliary dilatation. Pancreas: No ductal dilatation or inflammation. Spleen: Normal in size without focal abnormality. Adrenals/Urinary Tract: Normal adrenal glands. Mild prominence of both renal pelvises without frank hydronephrosis. Homogeneous renal enhancement with symmetric excretion on delayed phase imaging. No evidence of focal renal lesion. Urinary bladder is nondistended. No bladder wall thickening. Stomach/Bowel: Inflamed sigmoid diverticulum, series 2, image 64, with  pericolonic edema and fat stranding consistent with acute diverticulitis. No evidence of abscess, however there is extraluminal air that extends into the adjacent retroperitoneum and tracks cranially along the gonadal vessels, largest pocket seen series 2, image 45. Multiple additional noninflamed colonic diverticula. Nondistended stomach. Few scattered fluid-filled small bowel loops in the pelvis are likely reactive. No obstruction. Appendix not confidently visualized, no evidence of appendicitis. Vascular/Lymphatic: Normal caliber abdominal aorta. The portal vein is patent. No evidence of portal or mesenteric gas. No venous thrombus. No adenopathy. Reproductive: Prominent left periuterine and adnexal vascularity. No adnexal mass. Uterus is otherwise unremarkable. Other: Extraluminal air tracks into the left retroperitoneum along the left gonadal vessels. No focal fluid collection. No ascites. Musculoskeletal: Mild L2 superior endplate compression fracture, likely chronic. Grade 1 anterolisthesis of L4 on L5 is facet mediated. Disc calcification at T11-T12. IMPRESSION: 1. Acute sigmoid diverticulitis. Unusual pattern of perforation with air tracking  in the left retroperitoneum along the ovarian veins, without associated pneumoperitoneum. No abscess. 2. Prominent left periuterine and adnexal vascularity, can be seen with pelvic congestion syndrome in the appropriate clinical setting, or alternatively may be reactive. 3. Mild L2 superior endplate compression fracture, likely chronic. These results were called by telephone at the time of interpretation on 03/12/2020 at 3:15 am to Dr Thayer Jew , who verbally acknowledged these results. Electronically Signed   By: Keith Rake M.D.   On: 03/12/2020 03:15    EKG: Independently reviewed. Sinus, no STT changes    Time spent:60 minutes Code Status:   FULL Family Communication:  No Family at bedside Disposition Plan: expect 2-3 day hospitalization Consults  called: general surgery DVT Prophylaxis: [place] Lovenox  Orson Eva, DO  [facility] Pager [phone]  If 7PM-7AM, please contact night-coverage [email or web address] Password [email or web address] 03/12/2020, 8:07 AM

## 2020-03-13 DIAGNOSIS — M069 Rheumatoid arthritis, unspecified: Secondary | ICD-10-CM

## 2020-03-13 LAB — BASIC METABOLIC PANEL
Anion gap: 7 (ref 5–15)
BUN: 10 mg/dL (ref 6–20)
CO2: 22 mmol/L (ref 22–32)
Calcium: 8.2 mg/dL — ABNORMAL LOW (ref 8.9–10.3)
Chloride: 109 mmol/L (ref 98–111)
Creatinine, Ser: 0.53 mg/dL (ref 0.44–1.00)
GFR calc Af Amer: >60 mL/min (ref >60)
GFR calc non Af Amer: >60 mL/min (ref >60)
Glucose, Bld: 108 mg/dL — ABNORMAL HIGH (ref 70–99)
Potassium: 3.5 mmol/L (ref 3.5–5.1)
Sodium: 138 mmol/L (ref 135–145)

## 2020-03-13 LAB — URINE CULTURE: Culture: 20000 — AB

## 2020-03-13 LAB — CBC
HCT: 32.4 % — ABNORMAL LOW (ref 36.0–46.0)
Hemoglobin: 10.7 g/dL — ABNORMAL LOW (ref 12.0–15.0)
MCH: 33.4 pg (ref 26.0–34.0)
MCHC: 33 g/dL (ref 30.0–36.0)
MCV: 101.3 fL — ABNORMAL HIGH (ref 80.0–100.0)
Platelets: 215 10*3/uL (ref 150–400)
RBC: 3.2 MIL/uL — ABNORMAL LOW (ref 3.87–5.11)
RDW: 14.3 % (ref 11.5–15.5)
WBC: 10 10*3/uL (ref 4.0–10.5)
nRBC: 0 % (ref 0.0–0.2)

## 2020-03-13 LAB — MAGNESIUM: Magnesium: 2.2 mg/dL (ref 1.7–2.4)

## 2020-03-13 MED ORDER — DOCUSATE SODIUM 100 MG PO CAPS
100.0000 mg | ORAL_CAPSULE | Freq: Two times a day (BID) | ORAL | Status: DC
  Administered 2020-03-13 – 2020-03-15 (×4): 100 mg via ORAL
  Filled 2020-03-13 (×4): qty 1

## 2020-03-13 MED ORDER — MONTELUKAST SODIUM 10 MG PO TABS
10.0000 mg | ORAL_TABLET | Freq: Every day | ORAL | Status: DC
  Administered 2020-03-13 – 2020-03-14 (×2): 10 mg via ORAL
  Filled 2020-03-13 (×2): qty 1

## 2020-03-13 MED ORDER — ZOLPIDEM TARTRATE 5 MG PO TABS
5.0000 mg | ORAL_TABLET | Freq: Every evening | ORAL | Status: DC | PRN
  Administered 2020-03-13 – 2020-03-14 (×2): 5 mg via ORAL
  Filled 2020-03-13 (×2): qty 1

## 2020-03-13 NOTE — Progress Notes (Signed)
[facility] Progress Note     Subjective: Improving. Still with some crampy pain and pain with gas. Some nausea. No bm.   Objective: Vital signs in last 24 hours: Temp:  [98.7 F (37.1 C)-98.8 F (37.1 C)] 98.7 F (37.1 C) (06/07 1349) Pulse Rate:  [86-87] 86 (06/07 1349) Resp:  [16] 16 (06/07 1349) BP: (145-162)/(75-84) 145/75 (06/07 1349) SpO2:  [97 %-100 %] 97 % (06/07 1349) Last BM Date: 03/12/20  Intake/Output from previous day: 06/06 0701 - 06/07 0700 In: 50 [IV Piggyback:50] Out: -  Intake/Output this shift: Total I/O In: 2888.5 [P.O.:960; I.V.:1824.4; IV Piggyback:104.1] Out: 3 [Urine:3]  General appearance: alert, cooperative and no distress Resp: normal work of breathing GI: soft, minimally distended, tender lower abdomen  Lab Results:  Recent Labs    03/12/20 0929 03/13/20 0601  WBC 15.8* 10.0  HGB 11.8* 10.7*  HCT 35.1* 32.4*  PLT 244 215   BMET Recent Labs    03/12/20 0023 03/13/20 0601  NA 137 138  K 3.5 3.5  CL 103 109  CO2 25 22  GLUCOSE 136* 108*  BUN 22* 10  CREATININE 0.63 0.53  CALCIUM 9.4 8.2*   PT/INR Recent Labs    03/12/20 0023  LABPROT 12.7  INR 1.0    Studies/Results: CT ABDOMEN PELVIS W CONTRAST  Result Date: 03/12/2020 CLINICAL DATA:  Lower abdominal pain.  Nausea and chills. EXAM: CT ABDOMEN AND PELVIS WITH CONTRAST TECHNIQUE: Multidetector CT imaging of the abdomen and pelvis was performed using the standard protocol following bolus administration of intravenous contrast. CONTRAST:  144mL OMNIPAQUE IOHEXOL 300 MG/ML  SOLN COMPARISON:  None. FINDINGS: Lower chest: Minor hypoventilatory atelectasis. No pleural fluid or confluent airspace disease. Hepatobiliary: No focal liver abnormality is seen. No gallstones, gallbladder wall thickening, or biliary dilatation. Pancreas: No ductal dilatation or inflammation. Spleen: Normal in size without focal abnormality. Adrenals/Urinary Tract: Normal adrenal glands.  Mild prominence of both renal pelvises without frank hydronephrosis. Homogeneous renal enhancement with symmetric excretion on delayed phase imaging. No evidence of focal renal lesion. Urinary bladder is nondistended. No bladder wall thickening. Stomach/Bowel: Inflamed sigmoid diverticulum, series 2, image 64, with pericolonic edema and fat stranding consistent with acute diverticulitis. No evidence of abscess, however there is extraluminal air that extends into the adjacent retroperitoneum and tracks cranially along the gonadal vessels, largest pocket seen series 2, image 45. Multiple additional noninflamed colonic diverticula. Nondistended stomach. Few scattered fluid-filled small bowel loops in the pelvis are likely reactive. No obstruction. Appendix not confidently visualized, no evidence of appendicitis. Vascular/Lymphatic: Normal caliber abdominal aorta. The portal vein is patent. No evidence of portal or mesenteric gas. No venous thrombus. No adenopathy. Reproductive: Prominent left periuterine and adnexal vascularity. No adnexal mass. Uterus is otherwise unremarkable. Other: Extraluminal air tracks into the left retroperitoneum along the left gonadal vessels. No focal fluid collection. No ascites. Musculoskeletal: Mild L2 superior endplate compression fracture, likely chronic. Grade 1 anterolisthesis of L4 on L5 is facet mediated. Disc calcification at T11-T12. IMPRESSION: 1. Acute sigmoid diverticulitis. Unusual pattern of perforation with air tracking in the left retroperitoneum along the ovarian veins, without associated pneumoperitoneum. No abscess. 2. Prominent left periuterine and adnexal vascularity, can be seen with pelvic congestion syndrome in the appropriate clinical setting, or alternatively may be reactive. 3. Mild L2 superior endplate compression fracture, likely chronic. These results were called by telephone at the time of interpretation on 03/12/2020 at 3:15 am to Dr Thayer Jew , who  verbally acknowledged these  results. Electronically Signed   By: Narda Rutherford M.D.   On: 03/12/2020 03:15    Anti-infectives: Anti-infectives (From admission, onward)   Start     Dose/Rate Route Frequency Ordered Stop   03/12/20 1400  piperacillin-tazobactam (ZOSYN) IVPB 3.375 g     3.375 g 12.5 mL/hr over 240 Minutes Intravenous Every 8 hours 03/12/20 1025     03/12/20 0330  piperacillin-tazobactam (ZOSYN) IVPB 3.375 g     3.375 g 100 mL/hr over 30 Minutes Intravenous  Once 03/12/20 0326 03/12/20 0401      Assessment/Plan: Erin Jones is a 59 yo with perforated diverticulitis without abscess. Improving. Leukocytosis improving. Still with pain and some nausea. - Would keep on liquid diet, full liquid ok today - Colace added - PRN for pain - IV zosyn for diverticulitis - Labs tomorrow  - Likely home after 3-4 days of IV antibiotics if doing better    LOS: 1 day    Erin Jones 03/13/2020

## 2020-03-13 NOTE — Progress Notes (Signed)
PROGRESS NOTE  Erin Jones SAY:[id number] DOB: 06/18/1961 DOA: 03/11/2020 PCP: [facility]  59 y.o. female with medical history of rheumatoid arthritis on methotrexate and upadacitinib presenting with 1 day history of lower abdominal pain that began on the evening of 03/11/2020.  The patient had a small bowel movement afterward without any relief.  She described her pain as constant moderate to severe in intensity.  She has not had any pain this severe before this episode.  The patient had been in her usual state of health prior to the onset of the abdominal pain.  She had denied any recent chest pain, shortness breath, coughing, hemoptysis, vomiting, diarrhea, hematochezia, melena.  Should some dysuria but denies any hematuria.  She has had some nausea without emesis.  The patient denies any new medications recently.  She has had some subjective chills. In the emergency department, the patient was febrile to 103.1 F, but she was hemodynamically stable with oxygen saturation 100% room air.  BMP, LFTs were unremarkable.  WBC 16.6 with hemoglobin 13.1 and platelets 270,000.  UA was negative for pyuria.  CT of the abdomen and pelvis showed an inflamed sigmoid colon diverticulum with pericolonic edema and stranding.  There was extraluminal air adjacent to the retroperitoneum.  The patient was started on IV Zosyn.  General surgery, Dr. Henreitta Leber was consulted.  Assessment/Plan: Acute diverticulitis with perforation -03/11/2020 CT abdomen/pelvis as discussed above -Clear liquid diet>>>full liquid -General surgery consult appreciated -Continue IV Zosyn -continue IV fluids -Judicious pain control -UA--neg for pyuria -follow up with Dr. Elnoria Howard for colonoscopy after dc  Rheumatoid arthritis -Last methotrexate dose on 03/09/2020 -Hold upadacitinib      Subjective: Pt states abd pain is 50% better.  Denies f/c, cp, sob, diarrhea.  No BM but passing flatus.  Had one episode of n/v this  am but feels this was due to bad tasting liquids  Objective: Vitals:   03/12/20 1600 03/12/20 1658 03/12/20 2000 03/13/20 1349  BP: (!) 171/73 (!) 152/67 (!) 162/84 (!) 145/75  Pulse: 94 86 87 86  Resp: 19 18 16 16   Temp:   98.8 F (37.1 C) 98.7 F (37.1 C)  TempSrc:   Oral Oral  SpO2: 98% 98% 100% 97%  Weight:  65.4 kg    Height:  5\' 4"  (1.626 m)      Intake/Output Summary (Last 24 hours) at 03/13/2020 1522 Last data filed at 03/13/2020 1500 Gross per 24 hour  Intake 2938.51 ml  Output 3 ml  Net 2935.51 ml   Weight change: 3.257 kg Exam:   General:  Pt is alert, follows commands appropriately, not in acute distress  HEENT: No icterus, No thrush, No neck mass, [place]/AT  Cardiovascular: RRR, S1/S2, no rubs, no gallops  Respiratory: CTA bilaterally, no wheezing, no crackles, no rhonchi  Abdomen: Soft/+BS, LLQ tender, non distended, no guarding  Extremities: No edema, No lymphangitis, No petechiae, No rashes, no synovitis   Data Reviewed: I have personally reviewed following labs and imaging studies Basic Metabolic Panel: Recent Labs  Lab 03/12/20 0023 03/13/20 0601  NA 137 138  K 3.5 3.5  CL 103 109  CO2 25 22  GLUCOSE 136* 108*  BUN 22* 10  CREATININE 0.63 0.53  CALCIUM 9.4 8.2*  MG  --  2.2   Liver Function Tests: Recent Labs  Lab 03/12/20 0023  AST 26  ALT 24  ALKPHOS 46  BILITOT 1.0  PROT 7.2  ALBUMIN 4.8   No results for input(s): LIPASE, AMYLASE in the last 168 hours. No results for input(s): AMMONIA in the last 168 hours. Coagulation Profile: Recent Labs  Lab 03/12/20 0023  INR 1.0   CBC: Recent Labs  Lab 03/12/20 0023 03/12/20 0929 03/13/20 0601  WBC 16.6* 15.8* 10.0  NEUTROABS 14.5* 12.9*  --   HGB 13.1 11.8* 10.7*  HCT 39.0 35.1* 32.4*  MCV 99.0 99.2 101.3*  PLT 278 244 215   Cardiac Enzymes: No results for input(s): CKTOTAL, CKMB, CKMBINDEX, TROPONINI in the last 168 hours. BNP: Invalid input(s): POCBNP CBG: No results  for input(s): GLUCAP in the last 168 hours. HbA1C: No results for input(s): HGBA1C in the last 72 hours. Urine analysis:    Component Value Date/Time   COLORURINE COLORLESS (A) 03/12/2020 0003   APPEARANCEUR CLEAR 03/12/2020 0003   LABSPEC 1.004 (L) 03/12/2020 0003   PHURINE 7.0 03/12/2020 0003   GLUCOSEU NEGATIVE 03/12/2020 0003   HGBUR SMALL (A) 03/12/2020 0003   BILIRUBINUR NEGATIVE 03/12/2020 0003   KETONESUR NEGATIVE 03/12/2020 0003   PROTEINUR NEGATIVE 03/12/2020 0003   NITRITE NEGATIVE 03/12/2020 0003   LEUKOCYTESUR NEGATIVE 03/12/2020 0003   Sepsis Labs: @LABRCNTIP (procalcitonin:4,lacticidven:4) ) Recent Results (from the past 240 hour(s))  Urine culture     Status: Abnormal   Collection Time: 03/12/20 12:03 AM   Specimen: In/Out Cath Urine  Result Value Ref Range Status   Specimen Description   Final    IN/OUT CATH URINE Performed at [facility], [address], [place], [place]    Special Requests   Final    NONE Performed at [facility], [address], [place], [place]    Culture (A)  Final    20,000 COLONIES/mL MULTIPLE SPECIES PRESENT, SUGGEST RECOLLECTION   Report Status 03/13/2020 FINAL  Final  Blood Culture (routine x 2)     Status: None (Preliminary result)   Collection Time: 03/12/20 12:24 AM   Specimen: Right Antecubital; Blood  Result Value Ref Range Status   Specimen Description RIGHT ANTECUBITAL  Final   Special Requests   Final    BOTTLES DRAWN AEROBIC AND ANAEROBIC Blood Culture adequate volume   Culture   Final    NO GROWTH 1 DAY Performed at [facility], [address], [place], [place]    Report Status PENDING  Incomplete  Blood Culture (routine x 2)     Status: None (Preliminary result)   Collection Time: 03/12/20 12:34 AM   Specimen: Left Antecubital; Blood  Result Value Ref Range Status   Specimen Description LEFT ANTECUBITAL  Final   Special Requests   Final    BOTTLES DRAWN AEROBIC AND ANAEROBIC  Blood Culture adequate volume   Culture   Final    NO GROWTH 1 DAY Performed at [facility], [address], [place], [place]    Report Status PENDING  Incomplete  SARS Coronavirus 2 by RT PCR (hospital order, performed in [facility] hospital lab) Nasopharyngeal Nasopharyngeal Swab     Status: None   Collection Time: 03/12/20  3:22 AM   Specimen: Nasopharyngeal Swab  Result Value Ref Range Status   SARS Coronavirus 2 NEGATIVE NEGATIVE Final    Comment: (NOTE) SARS-CoV-2 target nucleic acids are NOT DETECTED. The SARS-CoV-2 RNA is generally detectable in upper and lower respiratory specimens during the acute phase of infection. The lowest concentration of SARS-CoV-2 viral copies this assay can detect is 250 copies / mL. A negative result  does not preclude SARS-CoV-2 infection and should not be used as the sole basis for treatment or other patient management decisions.  A negative result may occur with improper specimen collection / handling, submission of specimen other than nasopharyngeal swab, presence of viral mutation(s) within the areas targeted by this assay, and inadequate number of viral copies (<250 copies / mL). A negative result must be combined with clinical observations, patient history, and epidemiological information. Fact Sheet for Patients:   BoilerBrush.com.cy Fact Sheet for Healthcare Providers: https://pope.com/ This test is not yet approved or cleared  by the [place] FDA and has been authorized for detection and/or diagnosis of SARS-CoV-2 by FDA under an Emergency Use Authorization (EUA).  This EUA will remain in effect (meaning this test can be used) for the duration of the COVID-19 declaration under Section 564(b)(1) of the Act, 21 U.S.C. section 360bbb-3(b)(1), unless the authorization is terminated or revoked sooner. Performed at [facility], [address] [place], [place] [address]       Scheduled Meds: . cycloSPORINE  1 drop Both Eyes BID  . enoxaparin (LOVENOX) injection  40 mg Subcutaneous Q24H  . melatonin  9 mg Oral QHS  . montelukast  10 mg Oral QHS   Continuous Infusions: . 0.9 % NaCl with KCl 20 mEq / L Stopped (03/13/20 1440)  . piperacillin-tazobactam (ZOSYN)  IV 3.375 g (03/13/20 1440)    Procedures/Studies: CT ABDOMEN PELVIS W CONTRAST  Result Date: 03/12/2020 CLINICAL DATA:  Lower abdominal pain.  Nausea and chills. EXAM: CT ABDOMEN AND PELVIS WITH CONTRAST TECHNIQUE: Multidetector CT imaging of the abdomen and pelvis was performed using the standard protocol following bolus administration of intravenous contrast. CONTRAST:  OMNIPAQUE IOHEXOL 300 MG/ML  SOLN COMPARISON:  None. FINDINGS: Lower chest: Minor hypoventilatory atelectasis. No pleural fluid or confluent airspace disease. Hepatobiliary: No focal liver abnormality is seen. No gallstones, gallbladder wall thickening, or biliary dilatation. Pancreas: No ductal dilatation or inflammation. Spleen: Normal in size without focal abnormality. Adrenals/Urinary Tract: Normal adrenal glands. Mild prominence of both renal pelvises without frank hydronephrosis. Homogeneous renal enhancement with symmetric excretion on delayed phase imaging. No evidence of focal renal lesion. Urinary bladder is nondistended. No bladder wall thickening. Stomach/Bowel: Inflamed sigmoid diverticulum, series 2, image 64, with pericolonic edema and fat stranding consistent with acute diverticulitis. No evidence of abscess, however there is extraluminal air that extends into the adjacent retroperitoneum and tracks cranially along the gonadal vessels, largest pocket seen series 2, image 45. Multiple additional noninflamed colonic diverticula. Nondistended stomach. Few scattered fluid-filled small bowel loops in the pelvis are likely reactive. No obstruction. Appendix not confidently visualized, no evidence of appendicitis. Vascular/Lymphatic:  Normal caliber abdominal aorta. The portal vein is patent. No evidence of portal or mesenteric gas. No venous thrombus. No adenopathy. Reproductive: Prominent left periuterine and adnexal vascularity. No adnexal mass. Uterus is otherwise unremarkable. Other: Extraluminal air tracks into the left retroperitoneum along the left gonadal vessels. No focal fluid collection. No ascites. Musculoskeletal: Mild L2 superior endplate compression fracture, likely chronic. Grade 1 anterolisthesis of L4 on L5 is facet mediated. Disc calcification at T11-T12. IMPRESSION: 1. Acute sigmoid diverticulitis. Unusual pattern of perforation with air tracking in the left retroperitoneum along the ovarian veins, without associated pneumoperitoneum. No abscess. 2. Prominent left periuterine and adnexal vascularity, can be seen with pelvic congestion syndrome in the appropriate clinical setting, or alternatively may be reactive. 3. Mild L2 superior endplate compression fracture, likely chronic. These results were called by telephone at  the time of interpretation on 03/12/2020 at 3:15 am to Dr Thayer Jew , who verbally acknowledged these results. Electronically Signed   By: Keith Rake M.D.   On: 03/12/2020 03:15    Orson Eva, DO  [facility]  If 7PM-7AM, please contact night-coverage [email or web address] Password [email or web address] 03/13/2020, 3:22 PM   LOS: 1 day

## 2020-03-13 NOTE — Plan of Care (Signed)

## 2020-03-14 LAB — CBC
HCT: 30.3 % — ABNORMAL LOW (ref 36.0–46.0)
Hemoglobin: 10.2 g/dL — ABNORMAL LOW (ref 12.0–15.0)
MCH: 34 pg (ref 26.0–34.0)
MCHC: 33.7 g/dL (ref 30.0–36.0)
MCV: 101 fL — ABNORMAL HIGH (ref 80.0–100.0)
Platelets: 211 10*3/uL (ref 150–400)
RBC: 3 MIL/uL — ABNORMAL LOW (ref 3.87–5.11)
RDW: 14.1 % (ref 11.5–15.5)
WBC: 8.5 10*3/uL (ref 4.0–10.5)
nRBC: 0 % (ref 0.0–0.2)

## 2020-03-14 LAB — BASIC METABOLIC PANEL
Anion gap: 5 (ref 5–15)
BUN: 7 mg/dL (ref 6–20)
CO2: 25 mmol/L (ref 22–32)
Calcium: 8.1 mg/dL — ABNORMAL LOW (ref 8.9–10.3)
Chloride: 110 mmol/L (ref 98–111)
Creatinine, Ser: 0.46 mg/dL (ref 0.44–1.00)
GFR calc Af Amer: >60 mL/min (ref >60)
GFR calc non Af Amer: >60 mL/min (ref >60)
Glucose, Bld: 100 mg/dL — ABNORMAL HIGH (ref 70–99)
Potassium: 3.4 mmol/L — ABNORMAL LOW (ref 3.5–5.1)
Sodium: 140 mmol/L (ref 135–145)

## 2020-03-14 LAB — MAGNESIUM: Magnesium: 2.2 mg/dL (ref 1.7–2.4)

## 2020-03-14 MED ORDER — OXYCODONE-ACETAMINOPHEN 5-325 MG PO TABS
1.0000 | ORAL_TABLET | ORAL | Status: DC | PRN
  Administered 2020-03-14: 1 via ORAL
  Filled 2020-03-14: qty 1

## 2020-03-14 MED ORDER — POTASSIUM CHLORIDE CRYS ER 20 MEQ PO TBCR
20.0000 meq | EXTENDED_RELEASE_TABLET | Freq: Once | ORAL | Status: AC
  Administered 2020-03-14: 20 meq via ORAL
  Filled 2020-03-14: qty 1

## 2020-03-14 NOTE — Progress Notes (Signed)
[facility] Progress Note     Subjective: Feeling better. On soft and did well for supper. No nausea/ some pain. No BM. Having Flatus.   Objective: Vital signs in last 24 hours: Temp:  [98.6 F (37 C)-99.4 F (37.4 C)] 98.6 F (37 C) (06/08 1425) Pulse Rate:  [77-81] 81 (06/08 1425) Resp:  [16-20] 16 (06/08 1425) BP: (142-162)/(71-91) 153/91 (06/08 1425) SpO2:  [97 %-100 %] 100 % (06/08 1425) Last BM Date: 03/12/20  Intake/Output from previous day: 06/07 0701 - 06/08 0700 In: 2888.5 [P.O.:960; I.V.:1824.4; IV Piggyback:104.1] Out: 3 [Urine:3] Intake/Output this shift: No intake/output data recorded.  General appearance: alert, cooperative and no distress Resp: normal work of breathing GI: soft, distened in lower abdomen/ full, tender mildly  Lab Results:  Recent Labs    03/13/20 0601 03/14/20 0433  WBC 10.0 8.5  HGB 10.7* 10.2*  HCT 32.4* 30.3*  PLT 215 211   BMET Recent Labs    03/13/20 0601 03/14/20 0433  NA 138 140  K 3.5 3.4*  CL 109 110  CO2 22 25  GLUCOSE 108* 100*  BUN 10 7  CREATININE 0.53 0.46  CALCIUM 8.2* 8.1*   PT/INR Recent Labs    03/12/20 0023  LABPROT 12.7  INR 1.0    Studies/Results: No results found.  Anti-infectives: Anti-infectives (From admission, onward)   Start     Dose/Rate Route Frequency Ordered Stop   03/12/20 1400  piperacillin-tazobactam (ZOSYN) IVPB 3.375 g     3.375 g 12.5 mL/hr over 240 Minutes Intravenous Every 8 hours 03/12/20 1025     03/12/20 0330  piperacillin-tazobactam (ZOSYN) IVPB 3.375 g     3.375 g 100 mL/hr over 30 Minutes Intravenous  Once 03/12/20 0326 03/12/20 0401      Assessment/Plan: Erin Jones is a 59 yo with perforated diverticulitis that is improving with IV antibiotics. PRN oral for pain IV zosyn Will transition to Augmentin tomorrow for total 14 day course Soft diet, colace, will hold on anything more aggressive right now  Discussed with Dr. Arbutus Leas.  Likely home  tomorrow   LOS: 2 days    Lucretia Roers 03/14/2020

## 2020-03-14 NOTE — Progress Notes (Signed)
PROGRESS NOTE  Erin Jones RSW:[id number] DOB: 04/24/1961 DOA: 03/11/2020 PCP: [facility]  Brief History:  59 y.o.femalewith medical history ofrheumatoid arthritis on methotrexate andupadacitinibpresenting with 1 day history of lower abdominal pain that began on the evening of 03/11/2020. The patient had a small bowel movement afterward without any relief. She described her pain as constant moderate to severe in intensity. She has not had any pain this severe before this episode. The patient had been in her usual state of health prior to the onset of the abdominal pain. She had denied any recent chest pain, shortness breath, coughing, hemoptysis, vomiting, diarrhea, hematochezia, melena. Should some dysuria but denies any hematuria. She has had some nausea without emesis. The patient denies any new medications recently. She has had some subjective chills. In the emergency department, the patient was febrile to 103.29F, but she was hemodynamically stable with oxygen saturation 100% room air. BMP, LFTs were unremarkable. WBC 16.6 with hemoglobin 13.1 and platelets 270,000. UA was negative for pyuria. CT of the abdomen and pelvis showed an inflamed sigmoid colon diverticulum with pericolonic edema and stranding. There was extraluminal air adjacent to the retroperitoneum. The patient was started on IV Zosyn. General surgery, Dr. Carson Myrtle consulted.  Assessment/Plan: Acute diverticulitis with perforation -03/11/2020 CT abdomen/pelvis as discussed above -Clear liquid diet>>>full liquid>>soft diet -General surgery consult appreciated -Continue IV Zosyn -continue IV fluids -Judicious pain control -UA--neg for pyuria -follow up with Dr. Elnoria Howard for colonoscopy after dc  Rheumatoid arthritis -Last methotrexate dose on 03/09/2020 -Holdupadacitinib      Status is: Inpatient  Remains inpatient appropriate because:IV treatments appropriate due to intensity of  illness or inability to take PO   Dispo: The patient is from: Home              Anticipated d/c is to: Home              Anticipated d/c date is: 1 day              Patient currently is not medically stable to d/c.        Family Communication:   Spouse updated at bedside 6/8  Consultants:  General surgery  Code Status:  FULL   DVT Prophylaxis:  [place] Lovenox   Procedures: As Listed in Progress Note Above  Antibiotics: Zosyn 6/5>>>     Subjective: Has some nausea.  Abdominal pain is slowly improving.  No BM.  Denies f/c, cp, sob, dysuria  Objective: Vitals:   03/13/20 1349 03/13/20 2224 03/14/20 0438 03/14/20 1425  BP: (!) 145/75 (!) 162/74 (!) 142/71 (!) 153/91  Pulse: 86 81 77 81  Resp: 16 20 20 16   Temp: 98.7 F (37.1 C) 99.4 F (37.4 C) 99.1 F (37.3 C) 98.6 F (37 C)  TempSrc: Oral Oral Oral Oral  SpO2: 97% 98% 97% 100%  Weight:      Height:       No intake or output data in the 24 hours ending 03/14/20 1844 Weight change:  Exam:   General:  Pt is alert, follows commands appropriately, not in acute distress  HEENT: No icterus, No thrush, No neck mass, [place]/AT  Cardiovascular: RRR, S1/S2, no rubs, no gallops  Respiratory: CTA bilaterally, no wheezing, no crackles, no rhonchi  Abdomen: Soft/+BS, LLQ and RLQ tender, non distended, no guarding  Extremities: No edema, No lymphangitis, No petechiae, No rashes, no synovitis   Data Reviewed: I have personally reviewed following  labs and imaging studies Basic Metabolic Panel: Recent Labs  Lab 03/12/20 0023 03/13/20 0601 03/14/20 0433  NA 137 138 140  K 3.5 3.5 3.4*  CL 103 109 110  CO2 25 22 25   GLUCOSE 136* 108* 100*  BUN 22* 10 7  CREATININE 0.63 0.53 0.46  CALCIUM 9.4 8.2* 8.1*  MG  --  2.2 2.2   Liver Function Tests: Recent Labs  Lab 03/12/20 0023  AST 26  ALT 24  ALKPHOS 46  BILITOT 1.0  PROT 7.2  ALBUMIN 4.8   No results for input(s): LIPASE, AMYLASE in the last 168  hours. No results for input(s): AMMONIA in the last 168 hours. Coagulation Profile: Recent Labs  Lab 03/12/20 0023  INR 1.0   CBC: Recent Labs  Lab 03/12/20 0023 03/12/20 0929 03/13/20 0601 03/14/20 0433  WBC 16.6* 15.8* 10.0 8.5  NEUTROABS 14.5* 12.9*  --   --   HGB 13.1 11.8* 10.7* 10.2*  HCT 39.0 35.1* 32.4* 30.3*  MCV 99.0 99.2 101.3* 101.0*  PLT 278 244 215 211   Cardiac Enzymes: No results for input(s): CKTOTAL, CKMB, CKMBINDEX, TROPONINI in the last 168 hours. BNP: Invalid input(s): POCBNP CBG: No results for input(s): GLUCAP in the last 168 hours. HbA1C: No results for input(s): HGBA1C in the last 72 hours. Urine analysis:    Component Value Date/Time   COLORURINE COLORLESS (A) 03/12/2020 0003   APPEARANCEUR CLEAR 03/12/2020 0003   LABSPEC 1.004 (L) 03/12/2020 0003   PHURINE 7.0 03/12/2020 0003   GLUCOSEU NEGATIVE 03/12/2020 0003   HGBUR SMALL (A) 03/12/2020 0003   BILIRUBINUR NEGATIVE 03/12/2020 0003   KETONESUR NEGATIVE 03/12/2020 0003   PROTEINUR NEGATIVE 03/12/2020 0003   NITRITE NEGATIVE 03/12/2020 0003   LEUKOCYTESUR NEGATIVE 03/12/2020 0003   Sepsis Labs: @LABRCNTIP (procalcitonin:4,lacticidven:4) ) Recent Results (from the past 240 hour(s))  Urine culture     Status: Abnormal   Collection Time: 03/12/20 12:03 AM   Specimen: In/Out Cath Urine  Result Value Ref Range Status   Specimen Description   Final    IN/OUT CATH URINE Performed at [facility], [address] [place], [address] [place]    Special Requests   Final    NONE Performed at [facility], [address] [place], [address] [place]    Culture (A)  Final    20,000 COLONIES/mL MULTIPLE SPECIES PRESENT, SUGGEST RECOLLECTION   Report Status 03/13/2020 FINAL  Final  Blood Culture (routine x 2)     Status: None (Preliminary result)   Collection Time: 03/12/20 12:24 AM   Specimen: Right Antecubital; Blood  Result Value Ref Range Status   Specimen Description RIGHT ANTECUBITAL   Final   Special Requests   Final    BOTTLES DRAWN AEROBIC AND ANAEROBIC Blood Culture adequate volume   Culture   Final    NO GROWTH 2 DAYS Performed at [facility], [address], [place], [address] [place]    Report Status PENDING  Incomplete  Blood Culture (routine x 2)     Status: None (Preliminary result)   Collection Time: 03/12/20 12:34 AM   Specimen: Left Antecubital; Blood  Result Value Ref Range Status   Specimen Description LEFT ANTECUBITAL  Final   Special Requests   Final    BOTTLES DRAWN AEROBIC AND ANAEROBIC Blood Culture adequate volume   Culture   Final    NO GROWTH 2 DAYS Performed at [facility], [address], [place], [address] [place]    Report Status PENDING  Incomplete  SARS Coronavirus 2 by RT PCR (hospital order, performed in [facility] hospital lab) Nasopharyngeal Nasopharyngeal Swab     Status: None   Collection Time: 03/12/20  3:22 AM   Specimen: Nasopharyngeal Swab  Result Value Ref Range Status   SARS Coronavirus 2 NEGATIVE NEGATIVE Final    Comment: (NOTE) SARS-CoV-2 target nucleic acids are NOT DETECTED. The SARS-CoV-2 RNA is generally detectable in upper and lower respiratory specimens during the acute phase of infection. The lowest concentration of SARS-CoV-2 viral copies this assay can detect is 250 copies / mL. A negative result does not preclude SARS-CoV-2 infection and should not be used as the sole basis for treatment or other patient management decisions.  A negative result may occur with improper specimen collection / handling, submission of specimen other than nasopharyngeal swab, presence of viral mutation(s) within the areas targeted by this assay, and inadequate number of viral copies (<250 copies / mL). A negative result must be combined with clinical observations, patient history, and epidemiological information. Fact Sheet for Patients:   StrictlyIdeas.no Fact Sheet for Healthcare  Providers: BankingDealers.co.za This test is not yet approved or cleared  by the [place] FDA and has been authorized for detection and/or diagnosis of SARS-CoV-2 by FDA under an Emergency Use Authorization (EUA).  This EUA will remain in effect (meaning this test can be used) for the duration of the COVID-19 declaration under Section 564(b)(1) of the Act, 21 U.S.C. section 360bbb-3(b)(1), unless the authorization is terminated or revoked sooner. Performed at [facility], [address], [place], [place] [address]      Scheduled Meds: . cycloSPORINE  1 drop Both Eyes BID  . docusate sodium  100 mg Oral BID  . enoxaparin (LOVENOX) injection  40 mg Subcutaneous Q24H  . melatonin  9 mg Oral QHS  . montelukast  10 mg Oral QHS   Continuous Infusions: . 0.9 % NaCl with KCl 20 mEq / L Stopped (03/13/20 1440)  . piperacillin-tazobactam (ZOSYN)  IV 3.375 g (03/14/20 1704)    Procedures/Studies: CT ABDOMEN PELVIS W CONTRAST  Result Date: 03/12/2020 CLINICAL DATA:  Lower abdominal pain.  Nausea and chills. EXAM: CT ABDOMEN AND PELVIS WITH CONTRAST TECHNIQUE: Multidetector CT imaging of the abdomen and pelvis was performed using the standard protocol following bolus administration of intravenous contrast. CONTRAST:  149mL OMNIPAQUE IOHEXOL 300 MG/ML  SOLN COMPARISON:  None. FINDINGS: Lower chest: Minor hypoventilatory atelectasis. No pleural fluid or confluent airspace disease. Hepatobiliary: No focal liver abnormality is seen. No gallstones, gallbladder wall thickening, or biliary dilatation. Pancreas: No ductal dilatation or inflammation. Spleen: Normal in size without focal abnormality. Adrenals/Urinary Tract: Normal adrenal glands. Mild prominence of both renal pelvises without frank hydronephrosis. Homogeneous renal enhancement with symmetric excretion on delayed phase imaging. No evidence of focal renal lesion. Urinary bladder is nondistended. No bladder wall  thickening. Stomach/Bowel: Inflamed sigmoid diverticulum, series 2, image 64, with pericolonic edema and fat stranding consistent with acute diverticulitis. No evidence of abscess, however there is extraluminal air that extends into the adjacent retroperitoneum and tracks cranially along the gonadal vessels, largest pocket seen series 2, image 45. Multiple additional noninflamed colonic diverticula. Nondistended stomach. Few scattered fluid-filled small bowel loops in the pelvis are likely reactive. No obstruction. Appendix not confidently visualized, no evidence of appendicitis. Vascular/Lymphatic: Normal caliber abdominal aorta. The portal vein is patent. No evidence of portal or mesenteric gas. No venous thrombus. No adenopathy. Reproductive: Prominent left periuterine and adnexal vascularity. No adnexal mass. Uterus  is otherwise unremarkable. Other: Extraluminal air tracks into the left retroperitoneum along the left gonadal vessels. No focal fluid collection. No ascites. Musculoskeletal: Mild L2 superior endplate compression fracture, likely chronic. Grade 1 anterolisthesis of L4 on L5 is facet mediated. Disc calcification at T11-T12. IMPRESSION: 1. Acute sigmoid diverticulitis. Unusual pattern of perforation with air tracking in the left retroperitoneum along the ovarian veins, without associated pneumoperitoneum. No abscess. 2. Prominent left periuterine and adnexal vascularity, can be seen with pelvic congestion syndrome in the appropriate clinical setting, or alternatively may be reactive. 3. Mild L2 superior endplate compression fracture, likely chronic. These results were called by telephone at the time of interpretation on 03/12/2020 at 3:15 am to Dr Ross Marcus , who verbally acknowledged these results. Electronically Signed   By: Narda Rutherford M.D.   On: 03/12/2020 03:15    Catarina Hartshorn, DO  [facility]  If 7PM-7AM, please contact night-coverage [email or web address] Password [facility] 03/14/2020,  6:44 PM   LOS: 2 days

## 2020-03-15 LAB — CBC
HCT: 31.3 % — ABNORMAL LOW (ref 36.0–46.0)
Hemoglobin: 10.3 g/dL — ABNORMAL LOW (ref 12.0–15.0)
MCH: 32.9 pg (ref 26.0–34.0)
MCHC: 32.9 g/dL (ref 30.0–36.0)
MCV: 100 fL (ref 80.0–100.0)
Platelets: 231 10*3/uL (ref 150–400)
RBC: 3.13 MIL/uL — ABNORMAL LOW (ref 3.87–5.11)
RDW: 14.2 % (ref 11.5–15.5)
WBC: 5.3 10*3/uL (ref 4.0–10.5)
nRBC: 0 % (ref 0.0–0.2)

## 2020-03-15 LAB — BASIC METABOLIC PANEL
Anion gap: 7 (ref 5–15)
BUN: 6 mg/dL (ref 6–20)
CO2: 26 mmol/L (ref 22–32)
Calcium: 8.4 mg/dL — ABNORMAL LOW (ref 8.9–10.3)
Chloride: 107 mmol/L (ref 98–111)
Creatinine, Ser: 0.55 mg/dL (ref 0.44–1.00)
GFR calc Af Amer: >60 mL/min (ref >60)
GFR calc non Af Amer: >60 mL/min (ref >60)
Glucose, Bld: 99 mg/dL (ref 70–99)
Potassium: 3.7 mmol/L (ref 3.5–5.1)
Sodium: 140 mmol/L (ref 135–145)

## 2020-03-15 MED ORDER — SACCHAROMYCES BOULARDII 250 MG PO CAPS
250.0000 mg | ORAL_CAPSULE | Freq: Two times a day (BID) | ORAL | 0 refills | Status: AC
Start: 2020-03-15 — End: 2020-03-30

## 2020-03-15 MED ORDER — MELOXICAM 7.5 MG PO TABS: 7.5000 mg | ORAL_TABLET | Freq: Every day | ORAL | Status: AC

## 2020-03-15 MED ORDER — ONDANSETRON 8 MG PO TBDP: 8.0000 mg | ORAL_TABLET | Freq: Three times a day (TID) | ORAL | 0 refills | Status: DC | PRN

## 2020-03-15 MED ORDER — RINVOQ 15 MG PO TB24: 15.0000 mg | ORAL_TABLET | Freq: Every day | ORAL | Status: DC

## 2020-03-15 MED ORDER — DOCUSATE SODIUM 100 MG PO CAPS: 100.0000 mg | ORAL_CAPSULE | Freq: Two times a day (BID) | ORAL | 0 refills | Status: DC

## 2020-03-15 MED ORDER — OXYCODONE-ACETAMINOPHEN 5-325 MG PO TABS: 1.0000 | ORAL_TABLET | Freq: Three times a day (TID) | ORAL | 0 refills | Status: AC | PRN

## 2020-03-15 MED ORDER — AMOXICILLIN-POT CLAVULANATE 875-125 MG PO TABS: 1.0000 | ORAL_TABLET | Freq: Two times a day (BID) | ORAL | 0 refills | Status: AC

## 2020-03-15 MED ORDER — ONDANSETRON 4 MG PO TBDP: 8.0000 mg | ORAL_TABLET | Freq: Three times a day (TID) | ORAL | Status: DC | PRN

## 2020-03-15 MED ORDER — MAGNESIUM HYDROXIDE 400 MG/5ML PO SUSP
15.0000 mL | Freq: Every day | ORAL | Status: DC
  Administered 2020-03-15: 15 mL via ORAL
  Filled 2020-03-15 (×2): qty 30

## 2020-03-15 NOTE — Discharge Summary (Signed)
Physician Discharge Summary  Erin Jones DEY:[id number] DOB: [birth year] DOA: 03/11/2020  PCP: [facility]  Admit date: 03/11/2020 Discharge date: 03/15/2020  Time spent: 35 minutes  Recommendations for Outpatient Follow-up:  1. Repeat basic metabolic panel to evaluate lites and renal function   Discharge Diagnoses:  Active Problems:   Rheumatoid arthritis (HCC)   Diverticulitis of large intestine with perforation without abscess or bleeding   Discharge Condition: Stable and improved.  Discharged home with regular follow-up with PCP in 10 days.  Follow-up with general surgery has also been arranged.  CODE STATUS: Full code  Diet recommendation: Low residue diet.  Filed Weights   03/11/20 2239 03/12/20 1658  Weight: 62.1 kg 65.4 kg    History of present illness:  59 y.o.femalewith medical history ofrheumatoid arthritis on methotrexate andupadacitinibpresenting with 1 day history of lower abdominal pain that began on the evening of 03/11/2020. The patient had a small bowel movement afterward without any relief. She described her pain as constant moderate to severe in intensity. She has not had any pain this severe before this episode. The patient had been in her usual state of health prior to the onset of the abdominal pain. She had denied any recent chest pain, shortness breath, coughing, hemoptysis, vomiting, diarrhea, hematochezia, melena. Should some dysuria but denies any hematuria. She has had some nausea without emesis. The patient denies any new medications recently. She has had some subjective chills. In the emergency department, the patient was febrile to 103.10F, but she was hemodynamically stable with oxygen saturation 100% room air. BMP, LFTs were unremarkable. WBC 16.6 with hemoglobin 13.1 and platelets 270,000. UA was negative for pyuria. CT of the abdomen and pelvis showed an inflamed sigmoid colon diverticulum with pericolonic edema and stranding.  There was extraluminal air adjacent to the retroperitoneum. The patient was started on IV Zosyn. General surgery, Dr. Carson Myrtle consulted.  Hospital Course:  1-Acute diverticulitis with microperforation -Excellent response to conservative management -Afebrile with normal WBCs at discharge -No abscess or surgical intervention needed.  -Patient advised to maintain adequate hydration and to complete oral antibiotics as instructed -She will follow-up with Dr. Kelvin Cellar for colonoscopy as an outpatient -Follow-up with general surgery on July 1 will be also arranged. -Low residue diet. -Prescription for analgesics and as needed antiemetics provided at discharge.  2-RA -Patient advised to continue holding Rinvoq until discussion with rheumatologist in the setting of acute infection.   3-insomnia -Continue the use of Eszopiclone at bedtime   *The rest of her medical problems remained stable during hospitalization and at discharge home medications regimen were resumed.  Patient will follow up with PCP in 10 days.  Procedures:  See below for x-ray reports.  Consultations:  General surgery  Discharge Exam: Vitals:   03/14/20 2122 03/15/20 0456  BP: (!) 148/76 (!) 148/83  Pulse: 88 73  Resp: 20 18  Temp: 99.1 F (37.3 C) 98.6 F (37 C)  SpO2: 100% 98%    General: No chest pain, no abdominal pain, no nausea, no vomiting.  Patient is afebrile.  Reports feeling ready for discharge. Cardiovascular: S1 and S2, no rubs, no gallops, no JVD. Respiratory: Clear to auscultation bilaterally; normal respiratory effort. Abdomen: Soft, positive bowel sounds, no distention, no guarding.  Mild tenderness with deep palpation in left lower quadrant. Extremities: No cyanosis, no clubbing.  Discharge Instructions   Discharge Instructions    Discharge instructions   Complete by: As directed    Take medications as prescribed Maintain adequate hydration  Follow-up with PCP in 10 days Follow-up  with general surgery as instructed (plan is for visit on July 1st), office will contact you with appointment details. Make sure to contact your rheumatologist to further decide resumption of Rinvoq   Increase activity slowly   Complete by: As directed      Allergies as of 03/15/2020      Reactions   Sulfa Antibiotics    Other Rash   Neosporin      Medication List    TAKE these medications   amoxicillin-clavulanate 875-125 MG tablet Commonly known as: Augmentin Take 1 tablet by mouth 2 (two) times daily for 10 days.   calcium carbonate 1250 (500 Ca) MG chewable tablet Commonly known as: OS-CAL Chew 1 tablet by mouth daily.   cetirizine 10 MG tablet Commonly known as: ZYRTEC Take 10 mg by mouth daily.   docusate sodium 100 MG capsule Commonly known as: COLACE Take 1 capsule (100 mg total) by mouth 2 (two) times daily. Hold for diarrhea   Eszopiclone 3 MG Tabs Take 3 mg by mouth at bedtime.   folic acid 1 MG tablet Commonly known as: FOLVITE Take 1 mg by mouth daily.   meloxicam 7.5 MG tablet Commonly known as: MOBIC Take 1 tablet (7.5 mg total) by mouth daily. Hold for one week. What changed: additional instructions   montelukast 10 MG tablet Commonly known as: SINGULAIR Take 10 mg by mouth at bedtime.   multivitamin capsule Take 1 capsule by mouth daily.   ondansetron 8 MG disintegrating tablet Commonly known as: ZOFRAN-ODT Take 1 tablet (8 mg total) by mouth every 8 (eight) hours as needed for nausea or vomiting.   oxyCODONE-acetaminophen 5-325 MG tablet Commonly known as: PERCOCET/ROXICET Take 1 tablet by mouth every 8 (eight) hours as needed for up to 5 days for severe pain.   pilocarpine 5 MG tablet Commonly known as: SALAGEN Take 5 mg by mouth in the morning, at noon, and at bedtime.   Restasis 0.05 % ophthalmic emulsion Generic drug: cycloSPORINE Place 1 drop into both eyes 2 (two) times daily.   Rinvoq 15 MG Tb24 Generic drug: Upadacitinib  ER Take 15 mg by mouth daily. Contact your rheumatologist for instructions about resumption time on this drug with acute infection. What changed: additional instructions   saccharomyces boulardii 250 MG capsule Commonly known as: Florastor Take 1 capsule (250 mg total) by mouth 2 (two) times daily for 15 days.   valACYclovir 500 MG tablet Commonly known as: VALTREX Take 500 mg by mouth daily.   Viactiv Calcium Plus D 650-12.5-40 MG-MCG-MCG Chew Generic drug: Calcium-Vitamin D-Vitamin K Chew 1 tablet by mouth in the morning and at bedtime.      Allergies  Allergen Reactions  . Sulfa Antibiotics   . Other Rash    Neosporin   Follow-up Information    Lucretia Roers, MD Follow up on 04/06/2020.   Specialty: General Surgery Why: perforated diverticulitis follow up.  Contact information: [address] [facility] [address] [phone]        [facility]. Schedule an appointment as soon as possible for a visit in 10 day(s).   Specialty: Internal Medicine Contact information: [address] [place] [address] [phone]           The results of significant diagnostics from this hospitalization (including imaging, microbiology, ancillary and laboratory) are listed below for reference.    Significant Diagnostic Studies: CT ABDOMEN PELVIS W CONTRAST  Result Date: 03/12/2020  CLINICAL DATA:  Lower abdominal pain.  Nausea and chills. EXAM: CT ABDOMEN AND PELVIS WITH CONTRAST TECHNIQUE: Multidetector CT imaging of the abdomen and pelvis was performed using the standard protocol following bolus administration of intravenous contrast. CONTRAST:  135mL OMNIPAQUE IOHEXOL 300 MG/ML  SOLN COMPARISON:  None. FINDINGS: Lower chest: Minor hypoventilatory atelectasis. No pleural fluid or confluent airspace disease. Hepatobiliary: No focal liver abnormality is seen. No gallstones, gallbladder wall thickening, or biliary dilatation. Pancreas: No ductal dilatation or  inflammation. Spleen: Normal in size without focal abnormality. Adrenals/Urinary Tract: Normal adrenal glands. Mild prominence of both renal pelvises without frank hydronephrosis. Homogeneous renal enhancement with symmetric excretion on delayed phase imaging. No evidence of focal renal lesion. Urinary bladder is nondistended. No bladder wall thickening. Stomach/Bowel: Inflamed sigmoid diverticulum, series 2, image 64, with pericolonic edema and fat stranding consistent with acute diverticulitis. No evidence of abscess, however there is extraluminal air that extends into the adjacent retroperitoneum and tracks cranially along the gonadal vessels, largest pocket seen series 2, image 45. Multiple additional noninflamed colonic diverticula. Nondistended stomach. Few scattered fluid-filled small bowel loops in the pelvis are likely reactive. No obstruction. Appendix not confidently visualized, no evidence of appendicitis. Vascular/Lymphatic: Normal caliber abdominal aorta. The portal vein is patent. No evidence of portal or mesenteric gas. No venous thrombus. No adenopathy. Reproductive: Prominent left periuterine and adnexal vascularity. No adnexal mass. Uterus is otherwise unremarkable. Other: Extraluminal air tracks into the left retroperitoneum along the left gonadal vessels. No focal fluid collection. No ascites. Musculoskeletal: Mild L2 superior endplate compression fracture, likely chronic. Grade 1 anterolisthesis of L4 on L5 is facet mediated. Disc calcification at T11-T12. IMPRESSION: 1. Acute sigmoid diverticulitis. Unusual pattern of perforation with air tracking in the left retroperitoneum along the ovarian veins, without associated pneumoperitoneum. No abscess. 2. Prominent left periuterine and adnexal vascularity, can be seen with pelvic congestion syndrome in the appropriate clinical setting, or alternatively may be reactive. 3. Mild L2 superior endplate compression fracture, likely chronic. These results  were called by telephone at the time of interpretation on 03/12/2020 at 3:15 am to Dr Thayer Jew , who verbally acknowledged these results. Electronically Signed   By: Keith Rake M.D.   On: 03/12/2020 03:15   Microbiology: Recent Results (from the past 240 hour(s))  Urine culture     Status: Abnormal   Collection Time: 03/12/20 12:03 AM   Specimen: In/Out Cath Urine  Result Value Ref Range Status   Specimen Description   Final    IN/OUT CATH URINE Performed at [facility], [address] [place], [place] [address]    Special Requests   Final    NONE Performed at [facility], [address], [place], [place] [address]    Culture (A)  Final    20,000 COLONIES/mL MULTIPLE SPECIES PRESENT, SUGGEST RECOLLECTION   Report Status 03/13/2020 FINAL  Final  Blood Culture (routine x 2)     Status: None (Preliminary result)   Collection Time: 03/12/20 12:24 AM   Specimen: Right Antecubital; Blood  Result Value Ref Range Status   Specimen Description RIGHT ANTECUBITAL  Final   Special Requests   Final    BOTTLES DRAWN AEROBIC AND ANAEROBIC Blood Culture adequate volume   Culture   Final    NO GROWTH 3 DAYS Performed at [facility], [address] [place], [place] [address]    Report Status PENDING  Incomplete  Blood Culture (routine x 2)     Status: None (Preliminary result)  Collection Time: 03/12/20 12:34 AM   Specimen: Left Antecubital; Blood  Result Value Ref Range Status   Specimen Description LEFT ANTECUBITAL  Final   Special Requests   Final    BOTTLES DRAWN AEROBIC AND ANAEROBIC Blood Culture adequate volume   Culture   Final    NO GROWTH 3 DAYS Performed at [facility], [address], [place], [place] [address]    Report Status PENDING  Incomplete  SARS Coronavirus 2 by RT PCR (hospital order, performed in [facility] hospital lab) Nasopharyngeal Nasopharyngeal Swab     Status: None   Collection Time: 03/12/20  3:22 AM   Specimen: Nasopharyngeal Swab  Result  Value Ref Range Status   SARS Coronavirus 2 NEGATIVE NEGATIVE Final    Comment: (NOTE) SARS-CoV-2 target nucleic acids are NOT DETECTED. The SARS-CoV-2 RNA is generally detectable in upper and lower respiratory specimens during the acute phase of infection. The lowest concentration of SARS-CoV-2 viral copies this assay can detect is 250 copies / mL. A negative result does not preclude SARS-CoV-2 infection and should not be used as the sole basis for treatment or other patient management decisions.  A negative result may occur with improper specimen collection / handling, submission of specimen other than nasopharyngeal swab, presence of viral mutation(s) within the areas targeted by this assay, and inadequate number of viral copies (<250 copies / mL). A negative result must be combined with clinical observations, patient history, and epidemiological information. Fact Sheet for Patients:   BoilerBrush.com.cy Fact Sheet for Healthcare Providers: https://pope.com/ This test is not yet approved or cleared  by the [place] FDA and has been authorized for detection and/or diagnosis of SARS-CoV-2 by FDA under an Emergency Use Authorization (EUA).  This EUA will remain in effect (meaning this test can be used) for the duration of the COVID-19 declaration under Section 564(b)(1) of the Act, 21 U.S.C. section 360bbb-3(b)(1), unless the authorization is terminated or revoked sooner. Performed at [facility], [address], [place], [place] [address]      Labs: Basic Metabolic Panel: Recent Labs  Lab 03/12/20 0023 03/13/20 0601 03/14/20 0433 03/15/20 0551  NA 137 138 140 140  K 3.5 3.5 3.4* 3.7  CL 103 109 110 107  CO2 25 22 25 26   GLUCOSE 136* 108* 100* 99  BUN 22* 10 7 6   CREATININE 0.63 0.53 0.46 0.55  CALCIUM 9.4 8.2* 8.1* 8.4*  MG  --  2.2 2.2  --    Liver Function Tests: Recent Labs  Lab 03/12/20 0023  AST 26  ALT 24   ALKPHOS 46  BILITOT 1.0  PROT 7.2  ALBUMIN 4.8   CBC: Recent Labs  Lab 03/12/20 0023 03/12/20 0929 03/13/20 0601 03/14/20 0433 03/15/20 0551  WBC 16.6* 15.8* 10.0 8.5 5.3  NEUTROABS 14.5* 12.9*  --   --   --   HGB 13.1 11.8* 10.7* 10.2* 10.3*  HCT 39.0 35.1* 32.4* 30.3* 31.3*  MCV 99.0 99.2 101.3* 101.0* 100.0  PLT 278 244 215 211 231    Signed:  05/14/20 MD.  [facility] 03/15/2020, 1:35 PM

## 2020-03-15 NOTE — Progress Notes (Signed)
[facility] Progress Note     Subjective: Doing well. No BM. Wants to try milk of mag before leaving. Overall doing well.   Objective: Vital signs in last 24 hours: Temp:  [98.6 F (37 C)-99.1 F (37.3 C)] 98.6 F (37 C) (06/09 0456) Pulse Rate:  [73-88] 73 (06/09 0456) Resp:  [16-20] 18 (06/09 0456) BP: (148-153)/(76-91) 148/83 (06/09 0456) SpO2:  [98 %-100 %] 98 % (06/09 0456) Last BM Date: 03/12/20  Intake/Output from previous day: 06/08 0701 - 06/09 0700 In: 1686.5 [I.V.:1390.4; IV Piggyback:296.1] Out: -  Intake/Output this shift: Total I/O In: 240 [P.O.:240] Out: 1 [Urine:1]  General appearance: alert, cooperative and no distress Resp: normal work of breathing GI: soft, less full in the lower abdomen, mildly tender  Lab Results:  Recent Labs    03/14/20 0433 03/15/20 0551  WBC 8.5 5.3  HGB 10.2* 10.3*  HCT 30.3* 31.3*  PLT 211 231   BMET Recent Labs    03/14/20 0433 03/15/20 0551  NA 140 140  K 3.4* 3.7  CL 110 107  CO2 25 26  GLUCOSE 100* 99  BUN 7 6  CREATININE 0.46 0.55  CALCIUM 8.1* 8.4*   Anti-infectives: Anti-infectives (From admission, onward)   Start     Dose/Rate Route Frequency Ordered Stop   03/12/20 1400  piperacillin-tazobactam (ZOSYN) IVPB 3.375 g     3.375 g 12.5 mL/hr over 240 Minutes Intravenous Every 8 hours 03/12/20 1025     03/12/20 0330  piperacillin-tazobactam (ZOSYN) IVPB 3.375 g     3.375 g 100 mL/hr over 30 Minutes Intravenous  Once 03/12/20 0326 03/12/20 0401      Assessment/Plan: Ms. Brickner is a 59 yo doing well with perforated diverticulitis. Doing well overall. Wants to try milk of mag before leaving. PRN for pain home  Milk of mag now, and will see if has Bm before leaving, can still leave this afternoon even if no BM as long as feeling ok, having flatus F/u July 1 with me and referring to Dr. Karilyn Cota for colonoscopy   Updated Dr. Gwenlyn Perking.    LOS: 3 days    Lucretia Roers 03/15/2020

## 2020-03-15 NOTE — Discharge Instructions (Signed)
Diverticulosis/ Diverticulitis Information and Diet:   Diverticulosis is a condition in which small, bulging pouches (diverticuli) form inside the lower part of the intestine, usually in the colon. Constipation and straining during bowel movements can worsen the condition. A diet rich in fiber can help keep stools soft and prevent inflammation.  Diverticulitis occurs when the pouches in the colon become infected or inflamed. Dietary changes can help the colon heal.  Fiber is an important part of the diet for patients with diverticulosis. A high-fiber diet softens and gives bulk to the stool, allowing it to pass quickly and easily.  Diet for Diverticulosis Eat a high-fiber diet when you have diverticulosis. Fiber softens the stool and helps prevent constipation. It also can help decrease pressure in the colon and help prevent flare-ups of diverticulitis.  High-fiber foods include:  Beans and legumes Bran, whole wheat bread and whole grain cereals such as oatmeal Brown and wild rice Fruits such as apples, bananas and pears Vegetables such as broccoli, carrots, corn and squash Whole wheat pasta If you currently don't have a diet high in fiber, you should add fiber gradually. This helps avoid bloating and abdominal discomfort. The target is to eat 25 to 30 grams of fiber daily. Drink at least 8 cups of fluid daily. Fluid will help soften your stool. Exercise also promotes bowel movement and helps prevent constipation.  When the colon is not inflamed, eat popcorn, nuts and seeds as tolerated.  Diet for Diverticulitis During flare ups of diverticulitis, follow a clear liquid diet. Your doctor will let you know when to progress from clear liquids to low fiber solids and then back to your normal diet.  A clear liquid diet means no solid foods. Juices should have no pulp. During the clear liquid diet, you may consume:  Broth Clear juices such as apple, cranberry and grape. (Avoid orange  juice) Jell-O Popsicles  When you're able to eat solid food, choose low fiber foods while healing. Low fiber foods include:  Canned or cooked fruit without seeds or skin, such as applesauce and melon Canned or well cooked vegetables without seeds and skin Dairy products such as cheese, milk and yogurt Eggs Low-fiber cereal Meat that is ground or tender and well cooked Pasta White bread and white rice AVOID RED MEAT WHILE YOU HAVE AN ACTIVE FLARE.  AVOID NSAIDS, IBUPROFEN, ALEVE, ASPIRIN WHILE YOU HAVE AN ACTIVE FLARE.  EXERCISE AS WELL AS SMOKING CESSATION CAN HELP PREVENT RECURRENCES.   After symptoms improve, usually within two to four days, you may add 5 to 15 grams of fiber a day back into your diet. Resume your high fiber diet when you no longer have symptoms.  Contact Information: If you have questions or concerns, please call DR. Ashlon Lottman office, [phone], Monday- Thursday 8AM-5PM and Friday 8AM-12Noon. If it is after hours or on the weekend, please call [facility], [phone], and ask to speak to the surgeon on call for Dr. Henreitta Leber at [facility].   Diverticulitis  Diverticulitis is infection or inflammation of small pouches (diverticula) in the colon that form due to a condition called diverticulosis. Diverticula can trap stool (feces) and bacteria, causing infection and inflammation. Diverticulitis may cause severe stomach pain and diarrhea. It may lead to tissue damage in the colon that causes bleeding. The diverticula may also burst (rupture) and cause infected stool to enter other areas of the abdomen. Complications of diverticulitis can include:  Bleeding.  Severe infection.  Severe pain.  Rupture (perforation) of  the colon.  Blockage (obstruction) of the colon. What are the causes? This condition is caused by stool becoming trapped in the diverticula, which allows bacteria to grow in the diverticula. This leads to inflammation and infection. What  increases the risk? You are more likely to develop this condition if:  You have diverticulosis. The risk for diverticulosis increases if: ? You are overweight or obese. ? You use tobacco products. ? You do not get enough exercise.  You eat a diet that does not include enough fiber. High-fiber foods include fruits, vegetables, beans, nuts, and whole grains. What are the signs or symptoms? Symptoms of this condition may include:  Pain and tenderness in the abdomen. The pain is normally located on the left side of the abdomen, but it may occur in other areas.  Fever and chills.  Bloating.  Cramping.  Nausea.  Vomiting.  Changes in bowel routines.  Blood in your stool. How is this diagnosed? This condition is diagnosed based on:  Your medical history.  A physical exam.  Tests to make sure there is nothing else causing your condition. These tests may include: ? Blood tests. ? Urine tests. ? Imaging tests of the abdomen, including X-rays, ultrasounds, MRIs, or CT scans. How is this treated? Most cases of this condition are mild and can be treated at home. Treatment may include:  Taking over-the-counter pain medicines.  Following a clear liquid diet.  Taking antibiotic medicines by mouth.  Rest. More severe cases may need to be treated at a hospital. Treatment may include:  Not eating or drinking.  Taking prescription pain medicine.  Receiving antibiotic medicines through an IV tube.  Receiving fluids and nutrition through an IV tube.  Surgery. When your condition is under control, your health care provider may recommend that you have a colonoscopy. This is an exam to look at the entire large intestine. During the exam, a lubricated, bendable tube is inserted into the anus and then passed into the rectum, colon, and other parts of the large intestine. A colonoscopy can show how severe your diverticula are and whether something else may be causing your  symptoms. Follow these instructions at home: Medicines  Take over-the-counter and prescription medicines only as told by your health care provider. These include fiber supplements, probiotics, and stool softeners.  If you were prescribed an antibiotic medicine, take it as told by your health care provider. Do not stop taking the antibiotic even if you start to feel better.  Do not drive or use heavy machinery while taking prescription pain medicine. General instructions   Follow a full liquid diet or another diet as directed by your health care provider. After your symptoms improve, your health care provider may tell you to change your diet. He or she may recommend that you eat a diet that contains at least 25 g (25 grams) of fiber daily. Fiber makes it easier to pass stool. Healthy sources of fiber include: ? Berries. One cup contains 4-8 grams of fiber. ? Beans or lentils. One half cup contains 5-8 grams of fiber. ? Green vegetables. One cup contains 4 grams of fiber.  Exercise for at least 30 minutes, 3 times each week. You should exercise hard enough to raise your heart rate and break a sweat.  Keep all follow-up visits as told by your health care provider. This is important. You may need a colonoscopy. Contact a health care provider if:  Your pain does not improve.  You have a hard time  drinking or eating food.  Your bowel movements do not return to normal. Get help right away if:  Your pain gets worse.  Your symptoms do not get better with treatment.  Your symptoms suddenly get worse.  You have a fever.  You vomit more than one time.  You have stools that are bloody, black, or tarry. Summary  Diverticulitis is infection or inflammation of small pouches (diverticula) in the colon that form due to a condition called diverticulosis. Diverticula can trap stool (feces) and bacteria, causing infection and inflammation.  You are at higher risk for this condition if you have  diverticulosis and you eat a diet that does not include enough fiber.  Most cases of this condition are mild and can be treated at home. More severe cases may need to be treated at a hospital.  When your condition is under control, your health care provider may recommend that you have an exam called a colonoscopy. This exam can show how severe your diverticula are and whether something else may be causing your symptoms. This information is not intended to replace advice given to you by your health care provider. Make sure you discuss any questions you have with your health care provider. Document Revised: 09/05/2017 Document Reviewed: 10/26/2016 Elsevier Patient Education  2020 [employer].

## 2020-03-17 ENCOUNTER — Encounter (HOSPITAL_COMMUNITY): Payer: Self-pay | Admitting: *Deleted

## 2020-03-17 ENCOUNTER — Emergency Department (HOSPITAL_COMMUNITY)
Admission: EM | Admit: 2020-03-17 | Discharge: 2020-03-17 | Disposition: A | Discharge status: Home / Self Care | Payer: BC Managed Care – PPO | Attending: Emergency Medicine | Admitting: Emergency Medicine

## 2020-03-17 ENCOUNTER — Other Ambulatory Visit: Payer: Self-pay

## 2020-03-17 DIAGNOSIS — Z79899 Other long term (current) drug therapy: Secondary | ICD-10-CM | POA: Diagnosis not present

## 2020-03-17 DIAGNOSIS — Z792 Long term (current) use of antibiotics: Secondary | ICD-10-CM | POA: Diagnosis not present

## 2020-03-17 DIAGNOSIS — R509 Fever, unspecified: Secondary | ICD-10-CM | POA: Diagnosis present

## 2020-03-17 DIAGNOSIS — R1032 Left lower quadrant pain: Secondary | ICD-10-CM | POA: Insufficient documentation

## 2020-03-17 LAB — LIPASE, BLOOD: Lipase: 25 U/L (ref 11–51)

## 2020-03-17 LAB — URINALYSIS, ROUTINE W REFLEX MICROSCOPIC
Bacteria, UA: NONE SEEN
Bilirubin Urine: NEGATIVE
Glucose, UA: NEGATIVE mg/dL
Ketones, ur: NEGATIVE mg/dL
Leukocytes,Ua: NEGATIVE
Nitrite: NEGATIVE
Protein, ur: NEGATIVE mg/dL
Specific Gravity, Urine: 1.002 — ABNORMAL LOW (ref 1.005–1.030)
pH: 7 (ref 5.0–8.0)

## 2020-03-17 LAB — LACTIC ACID, PLASMA: Lactic Acid, Venous: 1.2 mmol/L (ref 0.5–1.9)

## 2020-03-17 LAB — COMPREHENSIVE METABOLIC PANEL
ALT: 34 U/L (ref 0–44)
AST: 29 U/L (ref 15–41)
Albumin: 4.9 g/dL (ref 3.5–5.0)
Alkaline Phosphatase: 54 U/L (ref 38–126)
Anion gap: 15 (ref 5–15)
BUN: 14 mg/dL (ref 6–20)
CO2: 25 mmol/L (ref 22–32)
Calcium: 10.5 mg/dL — ABNORMAL HIGH (ref 8.9–10.3)
Chloride: 98 mmol/L (ref 98–111)
Creatinine, Ser: 0.63 mg/dL (ref 0.44–1.00)
GFR calc Af Amer: >60 mL/min (ref >60)
GFR calc non Af Amer: >60 mL/min (ref >60)
Glucose, Bld: 114 mg/dL — ABNORMAL HIGH (ref 70–99)
Potassium: 4 mmol/L (ref 3.5–5.1)
Sodium: 138 mmol/L (ref 135–145)
Total Bilirubin: 0.4 mg/dL (ref 0.3–1.2)
Total Protein: 8.3 g/dL — ABNORMAL HIGH (ref 6.5–8.1)

## 2020-03-17 LAB — CBC WITH DIFFERENTIAL/PLATELET
Abs Immature Granulocytes: 0.06 10*3/uL (ref 0.00–0.07)
Basophils Absolute: 0 10*3/uL (ref 0.0–0.1)
Basophils Relative: 0 %
Eosinophils Absolute: 0.1 10*3/uL (ref 0.0–0.5)
Eosinophils Relative: 1 %
HCT: 39.9 % (ref 36.0–46.0)
Hemoglobin: 13.3 g/dL (ref 12.0–15.0)
Immature Granulocytes: 1 %
Lymphocytes Relative: 13 %
Lymphs Abs: 1.4 10*3/uL (ref 0.7–4.0)
MCH: 32.8 pg (ref 26.0–34.0)
MCHC: 33.3 g/dL (ref 30.0–36.0)
MCV: 98.3 fL (ref 80.0–100.0)
Monocytes Absolute: 1.1 10*3/uL — ABNORMAL HIGH (ref 0.1–1.0)
Monocytes Relative: 10 %
Neutro Abs: 7.9 10*3/uL — ABNORMAL HIGH (ref 1.7–7.7)
Neutrophils Relative %: 75 %
Platelets: 364 10*3/uL (ref 150–400)
RBC: 4.06 MIL/uL (ref 3.87–5.11)
RDW: 13.8 % (ref 11.5–15.5)
WBC: 10.4 10*3/uL (ref 4.0–10.5)
nRBC: 0 % (ref 0.0–0.2)

## 2020-03-17 LAB — CULTURE, BLOOD (ROUTINE X 2)
Culture: NO GROWTH
Culture: NO GROWTH
Special Requests: ADEQUATE
Special Requests: ADEQUATE

## 2020-03-17 NOTE — ED Triage Notes (Signed)
Released 2 days ago and diagnosed with diverticulitis, states she was advised to return if fever returned, states she has a fever last night and chills

## 2020-03-17 NOTE — ED Provider Notes (Signed)
[facility] EMERGENCY DEPARTMENT Provider Note   CSN: [id number] Arrival date & time: 03/17/20  1641     History Chief Complaint  Patient presents with  . Abdominal Pain  . Fever    Erin Jones is a 59 y.o. female.  HPI      Erin Jones is a 59 y.o. female with past medical history of rheumatoid arthritis who was admitted to the hospital on 03/12/2020 for acute diverticulitis with perforation.  She was discharged home 2 days ago.  Yesterday she reports having chills last evening and today.  She took her temp at home noticed a fever of 99.5, max temp last evening of 100.5.  Improved after taking Tylenol.  Today, she continues to have chills.  She contacted her PCP who advised her to return to the emergency department for further evaluation.  She does note having a bowel movement today after taking a laxative.  She describes a mild pain to her left lower abdomen that she describes as much improved from her initial visit.  She does report flatus.  Stool has been soft and brown in color, no bloody or tarry stools.  She denies chest pain, shortness of breath, vomiting, or nausea.  Past Medical History:  Diagnosis Date  . Arthritis    RA  . Bone loss    medication related bone loss   . Renal disorder    kidney stone    Patient Active Problem List   Diagnosis Date Noted  . Diverticulitis of large intestine with perforation without abscess or bleeding 03/12/2020  . Rheumatoid arthritis ([facility]) 12/08/2014    Past Surgical History:  Procedure Laterality Date  . BUNIONECTOMY    . COLPOSCOPY       OB History   No obstetric history on file.     Family History  Problem Relation Age of Onset  . Breast cancer Maternal Aunt   . Thyroid disease Mother     Social History   Tobacco Use  . Smoking status: Never Smoker  . Smokeless tobacco: Never Used  Vaping Use  . Vaping Use: Never used  Substance Use Topics  . Alcohol use: No    Alcohol/week: 0.0 standard drinks  . Drug use:  No    Home Medications Prior to Admission medications   Medication Sig Start Date End Date Taking? Authorizing Provider  amoxicillin-clavulanate (AUGMENTIN) 875-125 MG tablet Take 1 tablet by mouth 2 (two) times daily for 10 days. 03/15/20 03/25/20  Barton Dubois, MD  calcium carbonate (OS-CAL) 1250 (500 Ca) MG chewable tablet Chew 1 tablet by mouth daily.    [provider]  Calcium-Vitamin D-Vitamin K (VIACTIV CALCIUM PLUS D) 650-12.5-40 MG-MCG-MCG CHEW Chew 1 tablet by mouth in the morning and at bedtime.    [provider]  cetirizine (ZYRTEC) 10 MG tablet Take 10 mg by mouth daily.    [provider]  cycloSPORINE (RESTASIS) 0.05 % ophthalmic emulsion Place 1 drop into both eyes 2 (two) times daily.  08/08/19   [provider]  docusate sodium (COLACE) 100 MG capsule Take 1 capsule (100 mg total) by mouth 2 (two) times daily. Hold for diarrhea 03/15/20   Barton Dubois, MD  Eszopiclone 3 MG TABS Take 3 mg by mouth at bedtime.  10/05/19   [provider]  folic acid (FOLVITE) 1 MG tablet Take 1 mg by mouth daily.    [provider]  meloxicam (MOBIC) 7.5 MG tablet Take 1 tablet (7.5 mg total) by mouth  daily. Hold for one week. 03/15/20   Vassie Loll, MD  montelukast (SINGULAIR) 10 MG tablet Take 10 mg by mouth at bedtime.    [provider]  Multiple Vitamin (MULTIVITAMIN) capsule Take 1 capsule by mouth daily.    [provider]  ondansetron (ZOFRAN-ODT) 8 MG disintegrating tablet Take 1 tablet (8 mg total) by mouth every 8 (eight) hours as needed for nausea or vomiting. 03/15/20   Vassie Loll, MD  oxyCODONE-acetaminophen (PERCOCET/ROXICET) 5-325 MG tablet Take 1 tablet by mouth every 8 (eight) hours as needed for up to 5 days for severe pain. 03/15/20 03/20/20  Vassie Loll, MD  pilocarpine (SALAGEN) 5 MG tablet Take 5 mg by mouth in the morning, at noon, and at bedtime.     [provider]  saccharomyces boulardii  (FLORASTOR) 250 MG capsule Take 1 capsule (250 mg total) by mouth 2 (two) times daily for 15 days. 03/15/20 03/30/20  Vassie Loll, MD  Upadacitinib ER (RINVOQ) 15 MG TB24 Take 15 mg by mouth daily. Contact your rheumatologist for instructions about resumption time on this drug with acute infection. 03/15/20   Vassie Loll, MD  valACYclovir (VALTREX) 500 MG tablet Take 500 mg by mouth daily.  07/27/19   [provider]    Allergies    Sulfa antibiotics and Other  Review of Systems   Review of Systems  Constitutional: Negative for appetite change, chills and fever.  Respiratory: Negative for shortness of breath.   Cardiovascular: Negative for chest pain.  Gastrointestinal: Positive for abdominal pain. Negative for abdominal distention, blood in stool, constipation, nausea and vomiting.  Genitourinary: Negative for decreased urine volume, difficulty urinating, dysuria and flank pain.  Musculoskeletal: Negative for back pain.  Skin: Negative for color change and rash.  Neurological: Negative for dizziness, weakness and numbness.  Hematological: Negative for adenopathy.    Physical Exam Updated Vital Signs BP (!) 140/101   Pulse (!) 103   Temp 98.9 F (37.2 C)   Resp 14   Ht 5\' 4"  (1.626 m)   Wt 65.4 kg   SpO2 99%   BMI 24.75 kg/m   Physical Exam Vitals and nursing note reviewed.  Constitutional:      General: She is not in acute distress.    Appearance: Normal appearance. She is well-developed. She is not ill-appearing or toxic-appearing.  HENT:     Mouth/Throat:     Mouth: Mucous membranes are moist.     Pharynx: No oropharyngeal exudate or posterior oropharyngeal erythema.  Cardiovascular:     Rate and Rhythm: Normal rate and regular rhythm.     Pulses: Normal pulses.  Pulmonary:     Effort: Pulmonary effort is normal.  Abdominal:     General: There is no distension.     Palpations: Abdomen is soft.     Tenderness: There is abdominal tenderness (Mild  tenderness palpation of the left lower quadrant.  No guarding or rebound.  No CVA tenderness.).  Musculoskeletal:        General: Normal range of motion.     Right lower leg: No edema.     Left lower leg: No edema.  Skin:    General: Skin is warm.     Capillary Refill: Capillary refill takes less than 2 seconds.     Findings: No rash.  Neurological:     General: No focal deficit present.     Mental Status: She is alert.     ED Results / Procedures / Treatments  Labs (all labs ordered are listed, but only abnormal results are displayed) Labs Reviewed  CBC WITH DIFFERENTIAL/PLATELET - Abnormal; Notable for the following components:      Result Value   Neutro Abs 7.9 (*)    Monocytes Absolute 1.1 (*)    All other components within normal limits  COMPREHENSIVE METABOLIC PANEL - Abnormal; Notable for the following components:   Glucose, Bld 114 (*)    Calcium 10.5 (*)    Total Protein 8.3 (*)    All other components within normal limits  URINALYSIS, ROUTINE W REFLEX MICROSCOPIC - Abnormal; Notable for the following components:   Color, Urine COLORLESS (*)    Specific Gravity, Urine 1.002 (*)    Hgb urine dipstick SMALL (*)    All other components within normal limits  LACTIC ACID, PLASMA  LIPASE, BLOOD  LACTIC ACID, PLASMA    EKG None  Radiology No results found.  Procedures Procedures (including critical care time)  Medications Ordered in ED Medications - No data to display  ED Course  I have reviewed the triage vital signs and the nursing notes.  Pertinent labs & imaging results that were available during my care of the patient were reviewed by me and considered in my medical decision making (see chart for details).    MDM Rules/Calculators/A&P                        Patient here for reevaluation with reported fever and chills at home.  Max temp of 100.1 at home.  Afebrile here.  Admitted to the hospital on 03/12/2020 for acute diverticulitis with perforation.   No abscess.  She is currently taking Augmentin.  Since discharge home, she reports having flatus and significant bowel movement today.  She is well-appearing nontoxic.  Labs here are unremarkable.  Lactic acid within normal limits.  No peritoneal signs.  38  Spoke with Dr. Henreitta Leber who was in ED for another patient.  Discussed findings.  Recommends patient continue her oral antibiotics  Patient appears appropriate for discharge home, discussed monitoring fever and ER return if pain increases and fever is greater than 101.5.  Patient agrees to close follow-up with GI and with Dr. Henreitta Leber as discussed upon discharge.   Final Clinical Impression(s) / ED Diagnoses Final diagnoses:  Fever and chills    Rx / DC Orders ED Discharge Orders    None       Pauline Aus, PA-C 03/17/20 1938    Vanetta Mulders, MD 03/27/20 [phone]

## 2020-03-17 NOTE — Discharge Instructions (Addendum)
Your lab work this evening was reassuring.  Continue taking your antibiotics as directed.  Monitor your fever.  Return to the emergency department if you develop a persistent fever greater than 101.5.  Follow-up with your surgeon and GI specialist as previously recommended.

## 2020-04-06 ENCOUNTER — Other Ambulatory Visit: Payer: Self-pay

## 2020-04-06 ENCOUNTER — Encounter: Payer: Self-pay | Admitting: General Surgery

## 2020-04-06 ENCOUNTER — Ambulatory Visit (INDEPENDENT_AMBULATORY_CARE_PROVIDER_SITE_OTHER): Payer: BC Managed Care – PPO | Admitting: General Surgery

## 2020-04-06 VITALS — BP 144/85 | HR 88 | Temp 98.0°F | Resp 14 | Ht 65.0 in | Wt 155.0 lb

## 2020-04-06 DIAGNOSIS — R300 Dysuria: Secondary | ICD-10-CM

## 2020-04-06 DIAGNOSIS — K5720 Diverticulitis of large intestine with perforation and abscess without bleeding: Secondary | ICD-10-CM

## 2020-04-06 NOTE — Progress Notes (Signed)
[facility] Note   HPI:  59 y.o. Female presents to clinic for follow-up evaluation after diverticulitis with perforation admission to the hospital. She had no drainable collection. She says she is still having some pain but now it is RUQ. She also says she feels some burning with urination She is worried that she still having diverticulitis or something else going on.  She saw GI Dr. Elnoria Howard and says he started her on Linzess for constipation. She says that she has not started this yet and is worried about doing it. She has tried MOM but no success. She had her last colonoscopy 8 years ago she we had discussed repeat after her acute infection was resolved ~ 6-8 weeks after.    Her rheumatologist for her RA took her off Rinvoq and she is now on methotrexate and Enbrel. She will start Enbrel this week.    Review of Systems:  No fever or chills Tolerating diet Having some constipation still All other review of systems: otherwise negative   Vital Signs:  BP (!) 144/85   Pulse 88   Temp 98 F (36.7 C) (Temporal)   Resp 14   Ht 5\' 5"  (1.651 m)   Wt 155 lb (70.3 kg)   SpO2 98%   BMI 25.79 kg/m    Physical Exam:  Physical Exam Vitals reviewed.  HENT:     Head: Normocephalic.  Cardiovascular:     Rate and Rhythm: Normal rate.  Pulmonary:     Effort: Pulmonary effort is normal.  Neurological:     Mental Status: She is alert.      Assessment:  59 y.o. yo Female with prior perforated diverticulitis with gas but no drainable collection. This was her first episode. She had a prior colonoscopy but it was 8 years ago. She will need another one 6-8 weeks after her acute infection. She is still having some issues with constipation and some pain but not it is the RUQ. No gallstones on her prior CT. Also with dysuria.   Plan:  - CBC, CMP now, if LFTs elevated will get 41  - UA/ Urine culture given dysuria, could develop colovesicular fistula with perforation  - Will need  colonoscopy after the acute infection to assess and ensure not a perforation from mass and truly is diverticulitis and also to assess for no stricture given her constipation  - Ct a/p repeat ordered for interval improvement/ changes  - Will call with results - Patient sees Dr. [place] in 1 month and hopefully can get scheduled for colonoscopy - Discussed that we sometimes proceed with surgery after a complicated diverticulitis and we will continue to discuss this option    Future Appointments  Date Time Provider Department Center  05/18/2020  1:00 PM 07/18/2020, MD [facility] None    All of the above recommendations were discussed with the patient, and all of patient's questions were answered to her expressed satisfaction.  Lucretia Roers, MD [facility] [address] [place], [place] [phone] (office)

## 2020-04-06 NOTE — Patient Instructions (Addendum)
Will scheduled repeat CT scan. Someone should call you with a time. Will call you once it is completed with results.  Go get labs and urine analysis at the hospital. Will call with results.

## 2020-04-07 ENCOUNTER — Other Ambulatory Visit: Payer: Self-pay

## 2020-04-07 ENCOUNTER — Other Ambulatory Visit (HOSPITAL_COMMUNITY)
Admission: RE | Admit: 2020-04-07 | Discharge: 2020-04-07 | Disposition: A | Discharge status: Home / Self Care | Payer: BC Managed Care – PPO | Source: Ambulatory Visit | Attending: General Surgery | Admitting: General Surgery

## 2020-04-07 DIAGNOSIS — K5720 Diverticulitis of large intestine with perforation and abscess without bleeding: Secondary | ICD-10-CM | POA: Diagnosis present

## 2020-04-07 DIAGNOSIS — R300 Dysuria: Secondary | ICD-10-CM | POA: Diagnosis present

## 2020-04-07 LAB — CBC WITH DIFFERENTIAL/PLATELET
Abs Immature Granulocytes: 0.03 10*3/uL (ref 0.00–0.07)
Basophils Absolute: 0.1 10*3/uL (ref 0.0–0.1)
Basophils Relative: 1 %
Eosinophils Absolute: 0 10*3/uL (ref 0.0–0.5)
Eosinophils Relative: 0 %
HCT: 39.5 % (ref 36.0–46.0)
Hemoglobin: 12.7 g/dL (ref 12.0–15.0)
Immature Granulocytes: 0 %
Lymphocytes Relative: 15 %
Lymphs Abs: 1.5 10*3/uL (ref 0.7–4.0)
MCH: 32.7 pg (ref 26.0–34.0)
MCHC: 32.2 g/dL (ref 30.0–36.0)
MCV: 101.8 fL — ABNORMAL HIGH (ref 80.0–100.0)
Monocytes Absolute: 0.3 10*3/uL (ref 0.1–1.0)
Monocytes Relative: 3 %
Neutro Abs: 8.1 10*3/uL — ABNORMAL HIGH (ref 1.7–7.7)
Neutrophils Relative %: 81 %
Platelets: 355 10*3/uL (ref 150–400)
RBC: 3.88 MIL/uL (ref 3.87–5.11)
RDW: 13.3 % (ref 11.5–15.5)
WBC: 10 10*3/uL (ref 4.0–10.5)
nRBC: 0 % (ref 0.0–0.2)

## 2020-04-07 LAB — COMPREHENSIVE METABOLIC PANEL
ALT: 18 U/L (ref 0–44)
AST: 20 U/L (ref 15–41)
Albumin: 4.5 g/dL (ref 3.5–5.0)
Alkaline Phosphatase: 59 U/L (ref 38–126)
Anion gap: 10 (ref 5–15)
BUN: 20 mg/dL (ref 6–20)
CO2: 27 mmol/L (ref 22–32)
Calcium: 9.9 mg/dL (ref 8.9–10.3)
Chloride: 104 mmol/L (ref 98–111)
Creatinine, Ser: 0.55 mg/dL (ref 0.44–1.00)
GFR calc Af Amer: >60 mL/min (ref >60)
GFR calc non Af Amer: >60 mL/min (ref >60)
Glucose, Bld: 122 mg/dL — ABNORMAL HIGH (ref 70–99)
Potassium: 3.9 mmol/L (ref 3.5–5.1)
Sodium: 141 mmol/L (ref 135–145)
Total Bilirubin: 0.6 mg/dL (ref 0.3–1.2)
Total Protein: 7.7 g/dL (ref 6.5–8.1)

## 2020-04-08 ENCOUNTER — Telehealth: Payer: Self-pay | Admitting: General Surgery

## 2020-04-08 DIAGNOSIS — K5720 Diverticulitis of large intestine with perforation and abscess without bleeding: Secondary | ICD-10-CM

## 2020-04-08 DIAGNOSIS — R300 Dysuria: Secondary | ICD-10-CM

## 2020-04-08 NOTE — Telephone Encounter (Signed)
Results for Erin Jones, Erin Jones (MRN [id number]) as of 04/08/2020 13:11  Ref. Range 03/17/2020 18:15 04/07/2020 14:51  Sodium Latest Ref Range: 135 - 145 mmol/L  141  Potassium Latest Ref Range: 3.5 - 5.1 mmol/L  3.9  Chloride Latest Ref Range: 98 - 111 mmol/L  104  CO2 Latest Ref Range: 22 - 32 mmol/L  27  Glucose Latest Ref Range: 70 - 99 mg/dL  254 (H)  BUN Latest Ref Range: 6 - 20 mg/dL  20  Creatinine Latest Ref Range: 0.44 - 1.00 mg/dL  2.70  Calcium Latest Ref Range: 8.9 - 10.3 mg/dL  9.9  Anion gap Latest Ref Range: 5 - 15   10  Alkaline Phosphatase Latest Ref Range: 38 - 126 U/L  59  Albumin Latest Ref Range: 3.5 - 5.0 g/dL  4.5  AST Latest Ref Range: 15 - 41 U/L  20  ALT Latest Ref Range: 0 - 44 U/L  18  Total Protein Latest Ref Range: 6.5 - 8.1 g/dL  7.7  Total Bilirubin Latest Ref Range: 0.3 - 1.2 mg/dL  0.6  GFR, Est Non African American Latest Ref Range: >60 mL/min  >60  GFR, Est African American Latest Ref Range: >60 mL/min  >60  WBC Latest Ref Range: 4.0 - 10.5 K/uL  10.0  RBC Latest Ref Range: 3.87 - 5.11 MIL/uL  3.88  Hemoglobin Latest Ref Range: 12.0 - 15.0 g/dL  62.3  HCT Latest Ref Range: 36 - 46 %  39.5  MCV Latest Ref Range: 80.0 - 100.0 fL  101.8 (H)  MCH Latest Ref Range: 26.0 - 34.0 pg  32.7  MCHC Latest Ref Range: 30.0 - 36.0 g/dL  76.2  RDW Latest Ref Range: 11.5 - 15.5 %  13.3  Platelets Latest Ref Range: 150 - 400 K/uL  355  nRBC Latest Ref Range: 0.0 - 0.2 %  0.0  Neutrophils Latest Units: %  81  Lymphocytes Latest Units: %  15  Monocytes Relative Latest Units: %  3  Eosinophil Latest Units: %  0  Basophil Latest Units: %  1  Immature Granulocytes Latest Units: %  0  NEUT# Latest Ref Range: 1.7 - 7.7 K/uL  8.1 (H)  Lymphocyte # Latest Ref Range: 0.7 - 4.0 K/uL  1.5  Monocyte # Latest Ref Range: 0 - 1 K/uL  0.3  Eosinophils Absolute Latest Ref Range: 0 - 0 K/uL  0.0  Basophils Absolute Latest Ref Range: 0 - 0 K/uL  0.1  Abs Immature Granulocytes Latest Ref  Range: 0.00 - 0.07 K/uL  0.03  URINALYSIS, ROUTINE W REFLEX MICROSCOPIC Unknown Rpt (A)   Appearance Latest Ref Range: CLEAR  CLEAR   Bilirubin Urine Latest Ref Range: NEGATIVE  NEGATIVE   Color, Urine Latest Ref Range: YELLOW  COLORLESS (A)   Glucose, UA Latest Ref Range: NEGATIVE mg/dL NEGATIVE   Hgb urine dipstick Latest Ref Range: NEGATIVE  SMALL (A)   Ketones, ur Latest Ref Range: NEGATIVE mg/dL NEGATIVE   Leukocytes,Ua Latest Ref Range: NEGATIVE  NEGATIVE   Nitrite Latest Ref Range: NEGATIVE  NEGATIVE   pH Latest Ref Range: 5.0 - 8.0  7.0   Protein Latest Ref Range: NEGATIVE mg/dL NEGATIVE   Specific Gravity, Urine Latest Ref Range: 1.005 - 1.030  1.002 (L)   Bacteria, UA Latest Ref Range: NONE SEEN  NONE SEEN   RBC / HPF Latest Ref Range: 0 - 5 RBC/hpf 0-5   WBC, UA Latest Ref Range: 0 - 5  WBC/hpf 0-5     UA negative. CBC and CMP good.    CT a/p ordered and not scheduled. Told patient to call office next week to see when it would be done.  Algis Greenhouse, MD [facility] [address] [place], [place] [address] [phone] (office)

## 2020-04-09 LAB — URINE CULTURE: Culture: NO GROWTH

## 2020-05-02 ENCOUNTER — Other Ambulatory Visit: Payer: Self-pay

## 2020-05-02 ENCOUNTER — Ambulatory Visit (HOSPITAL_COMMUNITY)
Admission: RE | Admit: 2020-05-02 | Discharge: 2020-05-02 | Disposition: A | Discharge status: Home / Self Care | Payer: BC Managed Care – PPO | Source: Ambulatory Visit | Attending: General Surgery | Admitting: General Surgery

## 2020-05-02 ENCOUNTER — Ambulatory Visit (HOSPITAL_COMMUNITY): Payer: BC Managed Care – PPO

## 2020-05-02 ENCOUNTER — Telehealth: Payer: Self-pay | Admitting: General Surgery

## 2020-05-02 DIAGNOSIS — K5720 Diverticulitis of large intestine with perforation and abscess without bleeding: Secondary | ICD-10-CM | POA: Diagnosis present

## 2020-05-02 MED ORDER — IOHEXOL 300 MG/ML  SOLN
100.0000 mL | Freq: Once | INTRAMUSCULAR | Status: AC | PRN
  Administered 2020-05-02: 100 mL via INTRAVENOUS

## 2020-05-02 MED ORDER — IOHEXOL 300 MG/ML  SOLN: 75.0000 mL | Freq: Once | INTRAMUSCULAR | Status: DC | PRN

## 2020-05-02 NOTE — Telephone Encounter (Signed)
[facility]  Labs and CT all improved. No diverticulitis/ inflammation.   RA meds started back. Has started Enbrel.  Sees Dr. Elnoria Howard in a few weeks. Her colonoscopy was 8 years ago. I think she should get a colonoscopy as this was her first episode of diverticulitis and the timeframe since her last.  Discussed with patient. Will see August 12.  Algis Greenhouse, MD [facility] [address] [place], [place] [address] [phone] (office)

## 2020-05-18 ENCOUNTER — Encounter: Payer: Self-pay | Admitting: General Surgery

## 2020-05-18 ENCOUNTER — Other Ambulatory Visit: Payer: Self-pay

## 2020-05-18 ENCOUNTER — Ambulatory Visit (INDEPENDENT_AMBULATORY_CARE_PROVIDER_SITE_OTHER): Payer: BC Managed Care – PPO | Admitting: General Surgery

## 2020-05-18 VITALS — BP 159/81 | HR 82 | Temp 98.6°F | Resp 14 | Ht 65.0 in | Wt 136.0 lb

## 2020-05-18 DIAGNOSIS — K5720 Diverticulitis of large intestine with perforation and abscess without bleeding: Secondary | ICD-10-CM | POA: Diagnosis not present

## 2020-05-18 NOTE — Progress Notes (Signed)
[facility] Note   HPI:  59 y.o. Female presents to clinic for follow-up evaluation of her perforated diverticulitis. Patient reports doing well but still needing the laxative for BM. She also says she has some soreness at times in the LLQ. She is scheduled for colonoscopy at the end of the month.   Repeat CT and labs were all reassuring with resolved diverticulitis and normal labs.  Review of Systems:  No fevers or chills Using laxative Some soreness All other review of systems: otherwise negative   Vital Signs:  BP (!) 159/81   Pulse 82   Temp 98.6 F (37 C) (Oral)   Resp 14   Ht 5\' 5"  (1.651 m)   Wt 136 lb (61.7 kg)   SpO2 98%   BMI 22.63 kg/m    Physical Exam:  Physical Exam Vitals reviewed.  Cardiovascular:     Rate and Rhythm: Normal rate.  Pulmonary:     Effort: Pulmonary effort is normal.  Abdominal:     General: There is no distension.     Palpations: Abdomen is soft.  Neurological:     Mental Status: She is alert.     Assessment:  59 y.o. yo Female with resolving diverticulitis after first episode with perforation with gas but not requiring any drain. It has been 8-9 years since her last colonoscopy. she is getting a repeat this month. Patient with RA and on Enbrel now.   Plan:  - Colonoscopy at the end of the month with Dr. 41  - Will call to follow up and make plans  - Need to continue discussion about whether elective colectomy best option for her or not   Future Appointments  Date Time Provider Department Center  06/08/2020  3:45 PM 08/08/2020, MD [facility] None    All of the above recommendations were discussed with the patient, and all of patient's questions were answered to her expressed satisfaction.  Lucretia Roers, MD [facility] [address] [place], [place] [phone] (office)

## 2020-06-08 ENCOUNTER — Telehealth (INDEPENDENT_AMBULATORY_CARE_PROVIDER_SITE_OTHER): Payer: BC Managed Care – PPO | Admitting: General Surgery

## 2020-06-08 DIAGNOSIS — K5720 Diverticulitis of large intestine with perforation and abscess without bleeding: Secondary | ICD-10-CM

## 2020-06-08 NOTE — Progress Notes (Signed)
[facility]  Patient identified with 2 identifiers. Calling to see how her colonoscopy went. Diverticula and small polyp. Nothing major.   Feeling ok and doing well. Still with some constipation and some random abdominal pain.  Trying medication for constipation.  Trying to change diet and take in more water.  Would hold on repeat RUQ [place] for abdominal pain as she had normal [place] 2019 and LFTs were normal last month.   Will keep [place] updated and call if needed PRN.   Algis Greenhouse, MD [facility] [address] [place], [place] [address] [phone] (office)

## 2020-09-07 ENCOUNTER — Other Ambulatory Visit (HOSPITAL_COMMUNITY)
Admission: RE | Admit: 2020-09-07 | Discharge: 2020-09-07 | Disposition: A | Discharge status: Home / Self Care | Payer: BC Managed Care – PPO | Source: Ambulatory Visit | Attending: Sports Medicine | Admitting: Sports Medicine

## 2020-09-07 ENCOUNTER — Other Ambulatory Visit: Payer: Self-pay

## 2020-09-07 DIAGNOSIS — E559 Vitamin D deficiency, unspecified: Secondary | ICD-10-CM | POA: Diagnosis present

## 2020-09-07 DIAGNOSIS — R5383 Other fatigue: Secondary | ICD-10-CM | POA: Diagnosis present

## 2020-09-07 DIAGNOSIS — M810 Age-related osteoporosis without current pathological fracture: Secondary | ICD-10-CM | POA: Diagnosis present

## 2020-09-07 LAB — CBC WITH DIFFERENTIAL/PLATELET
Abs Immature Granulocytes: 0.02 10*3/uL (ref 0.00–0.07)
Basophils Absolute: 0 10*3/uL (ref 0.0–0.1)
Basophils Relative: 1 %
Eosinophils Absolute: 0.2 10*3/uL (ref 0.0–0.5)
Eosinophils Relative: 3 %
HCT: 39.3 % (ref 36.0–46.0)
Hemoglobin: 13 g/dL (ref 12.0–15.0)
Immature Granulocytes: 0 %
Lymphocytes Relative: 40 %
Lymphs Abs: 2.4 10*3/uL (ref 0.7–4.0)
MCH: 33 pg (ref 26.0–34.0)
MCHC: 33.1 g/dL (ref 30.0–36.0)
MCV: 99.7 fL (ref 80.0–100.0)
Monocytes Absolute: 0.6 10*3/uL (ref 0.1–1.0)
Monocytes Relative: 9 %
Neutro Abs: 2.8 10*3/uL (ref 1.7–7.7)
Neutrophils Relative %: 47 %
Platelets: 237 10*3/uL (ref 150–400)
RBC: 3.94 MIL/uL (ref 3.87–5.11)
RDW: 14.1 % (ref 11.5–15.5)
WBC: 5.9 10*3/uL (ref 4.0–10.5)
nRBC: 0 % (ref 0.0–0.2)

## 2020-09-07 LAB — COMPREHENSIVE METABOLIC PANEL
ALT: 21 U/L (ref 0–44)
AST: 22 U/L (ref 15–41)
Albumin: 4.4 g/dL (ref 3.5–5.0)
Alkaline Phosphatase: 52 U/L (ref 38–126)
Anion gap: 9 (ref 5–15)
BUN: 14 mg/dL (ref 6–20)
CO2: 26 mmol/L (ref 22–32)
Calcium: 9.6 mg/dL (ref 8.9–10.3)
Chloride: 104 mmol/L (ref 98–111)
Creatinine, Ser: 0.55 mg/dL (ref 0.44–1.00)
GFR, Estimated: >60 mL/min (ref >60)
Glucose, Bld: 99 mg/dL (ref 70–99)
Potassium: 3.8 mmol/L (ref 3.5–5.1)
Sodium: 139 mmol/L (ref 135–145)
Total Bilirubin: 0.6 mg/dL (ref 0.3–1.2)
Total Protein: 7.3 g/dL (ref 6.5–8.1)

## 2020-09-07 LAB — VITAMIN D 25 HYDROXY (VIT D DEFICIENCY, FRACTURES): Vit D, 25-Hydroxy: 66.87 ng/mL (ref 30–100)

## 2020-09-25 ENCOUNTER — Ambulatory Visit (HOSPITAL_COMMUNITY): Payer: BC Managed Care – PPO

## 2020-09-26 ENCOUNTER — Other Ambulatory Visit: Payer: Self-pay

## 2020-09-26 ENCOUNTER — Ambulatory Visit (HOSPITAL_COMMUNITY)
Admission: RE | Admit: 2020-09-26 | Discharge: 2020-09-26 | Disposition: A | Discharge status: Home / Self Care | Payer: BC Managed Care – PPO | Source: Ambulatory Visit | Attending: Sports Medicine | Admitting: Sports Medicine

## 2020-09-26 DIAGNOSIS — M810 Age-related osteoporosis without current pathological fracture: Secondary | ICD-10-CM | POA: Insufficient documentation

## 2020-09-26 MED ORDER — ZOLEDRONIC ACID 5 MG/100ML IV SOLN
INTRAVENOUS | Status: AC
  Administered 2020-09-26: 5 mg via INTRAVENOUS
  Filled 2020-09-26: qty 100

## 2020-09-26 MED ORDER — ZOLEDRONIC ACID 5 MG/100ML IV SOLN: 5.0000 mg | Freq: Once | INTRAVENOUS | Status: AC

## 2021-01-22 ENCOUNTER — Ambulatory Visit: Payer: BC Managed Care – PPO | Admitting: Podiatry

## 2021-03-08 ENCOUNTER — Ambulatory Visit (INDEPENDENT_AMBULATORY_CARE_PROVIDER_SITE_OTHER): Payer: BC Managed Care – PPO | Admitting: Podiatry

## 2021-03-08 ENCOUNTER — Encounter: Payer: Self-pay | Admitting: Podiatry

## 2021-03-08 ENCOUNTER — Other Ambulatory Visit: Payer: Self-pay

## 2021-03-08 DIAGNOSIS — M779 Enthesopathy, unspecified: Secondary | ICD-10-CM

## 2021-03-08 MED ORDER — TRIAMCINOLONE ACETONIDE 10 MG/ML IJ SUSP
10.0000 mg | Freq: Once | INTRAMUSCULAR | Status: AC
  Administered 2021-03-08: 10 mg

## 2021-03-13 NOTE — Progress Notes (Signed)
Subjective:   Patient ID: Erin Jones, female   DOB: 60 y.o.   MRN: [id number]   HPI Patient presents with a lot of pain in the left foot stating its been present for the last couple months and she does not remember injury   ROS      Objective:  Physical Exam  Neurovascular status intact inflammation of the third MPJ left fluid buildup around the joint surface with pain upon palpation     Assessment:  Inflammatory capsulitis third MPJ left     Plan:  H&P reviewed condition and recommended padding therapy rigid bottom shoes and I went ahead today did a sterile forefoot anesthetic block sterile prep applied aspirating the third MPJ getting out a small amount of clear fluid injected quarter cc dexamethasone Kenalog and 2 on the continuation and time laboratories as needed.  Reappoint to recheck  X-rays indicate no signs of fracture or advanced arthritis with this condition

## 2021-09-26 ENCOUNTER — Ambulatory Visit (HOSPITAL_COMMUNITY): Payer: BC Managed Care – PPO

## 2021-10-02 ENCOUNTER — Other Ambulatory Visit (HOSPITAL_COMMUNITY): Payer: Self-pay | Admitting: *Deleted

## 2021-10-03 ENCOUNTER — Ambulatory Visit (HOSPITAL_COMMUNITY)
Admission: RE | Admit: 2021-10-03 | Discharge: 2021-10-03 | Disposition: A | Discharge status: Home / Self Care | Payer: BC Managed Care – PPO | Source: Ambulatory Visit | Attending: Sports Medicine | Admitting: Sports Medicine

## 2021-10-03 ENCOUNTER — Other Ambulatory Visit: Payer: Self-pay

## 2021-10-03 DIAGNOSIS — M810 Age-related osteoporosis without current pathological fracture: Secondary | ICD-10-CM | POA: Diagnosis not present

## 2021-10-03 MED ORDER — ZOLEDRONIC ACID 5 MG/100ML IV SOLN: 5.0000 mg | Freq: Once | INTRAVENOUS | Status: AC

## 2021-10-03 MED ORDER — ZOLEDRONIC ACID 5 MG/100ML IV SOLN
INTRAVENOUS | Status: AC
  Administered 2021-10-03: 13:00:00 5 mg via INTRAVENOUS
  Filled 2021-10-03: qty 100

## 2021-11-15 ENCOUNTER — Ambulatory Visit: Payer: BC Managed Care – PPO | Admitting: Allergy & Immunology

## 2021-11-19 ENCOUNTER — Other Ambulatory Visit: Payer: Self-pay

## 2021-11-19 ENCOUNTER — Encounter: Payer: Self-pay | Admitting: Allergy & Immunology

## 2021-11-19 ENCOUNTER — Ambulatory Visit (INDEPENDENT_AMBULATORY_CARE_PROVIDER_SITE_OTHER): Payer: BC Managed Care – PPO | Admitting: Allergy & Immunology

## 2021-11-19 VITALS — BP 132/84 | HR 92 | Temp 98.2°F | Resp 18 | Ht 65.0 in | Wt 138.8 lb

## 2021-11-19 DIAGNOSIS — H10403 Unspecified chronic conjunctivitis, bilateral: Secondary | ICD-10-CM

## 2021-11-19 NOTE — Patient Instructions (Addendum)
1. Chronic conjunctivitis of both eyes, unspecified chronic conjunctivitis type - Testing was negative to the entire panel, both the prick testing and the intradermal testing. - Copy of testing results provided. - This is clearly NOT environmental allergies. - I would stop one of your eye drops for one week.  - If there is no improvement, stop the other eye drop for one week. - This would help to determine whether these are related to a reaction to your eye drops.   2. Return if symptoms worsen or fail to improve.    Please inform us of any Emergency Department visits, hospitalizations, or changes in symptoms. Call us before going to the ED for breathing or allergy symptoms since we might be able to fit you in for a sick visit. Feel free to contact us anytime with any questions, problems, or concerns.  It was a pleasure to meet you today! I wish there was more wrong with you from an allergy perspective so we would see you again! You are an absolute delight!   Websites that have reliable patient information: 1. American Academy of Asthma, Allergy, and Immunology: www.aaaai.org 2. Food Allergy Research and Education (FARE): foodallergy.org 3. Mothers of Asthmatics: http://www.asthmacommunitynetwork.org 4. American College of Allergy, Asthma, and Immunology: www.acaai.org   COVID-19 Vaccine Information can be found at: [email or web address] For questions related to vaccine distribution or appointments, please email [email or web address] or call [phone].   We realize that you might be concerned about having an allergic reaction to the COVID19 vaccines. To help with that concern, WE ARE OFFERING THE COVID19 VACCINES IN OUR OFFICE! Ask the front desk for dates!     Like [place] on [employer] and Instagram for our latest updates!      A healthy democracy works best when [employer] participate! Make sure you are registered to vote! If  you have moved or changed any of your contact information, you will need to get this updated before voting!  In some cases, you MAY be able to register to vote online: [email or web address]       Airborne Adult Perc - 11/19/21 1622     Time Antigen Placed 1522    Allergen Manufacturer Waynette Buttery    Location Back    Number of Test 59    1. Control-Buffer 50% Glycerol Negative    2. Control-Histamine 1 mg/ml 2+    3. Albumin saline Negative    4. Bahia Negative    5. French Southern Territories Negative    6. Johnson Negative    7. [place] Blue Negative    8. Meadow Fescue Negative    9. Perennial Rye Negative    10. Sweet Vernal Negative    11. Timothy Negative    12. Cocklebur Negative    13. Burweed Marshelder Negative    14. Ragweed, short Negative    15. Ragweed, Giant Negative    16. Plantain,  English Negative    17. Lamb's Quarters Negative    18. Sheep Sorrell Negative    19. Rough Pigweed Negative    20. Marsh Elder, Rough Negative    21. Mugwort, Common Negative    22. Ash mix Negative    23. Birch mix Negative    24. Beech American Negative    25. Box, Elder Negative    26. Cedar, red Negative    27. Cottonwood, Guinea-Bissau Negative    28. Elm mix Negative    29. Hickory Negative    30. Maple mix Negative    31.  Oak, Guinea-Bissau mix Negative    32. Pecan Pollen Negative    33. Pine mix Negative    34. Sycamore Eastern Negative    35. Walnut, Black Pollen Negative    36. Alternaria alternata Negative    37. Cladosporium Herbarum Negative    38. Aspergillus mix Negative    39. Penicillium mix Negative    40. Bipolaris sorokiniana (Helminthosporium) Negative    41. Drechslera spicifera (Curvularia) Negative    42. Mucor plumbeus Negative    43. Fusarium moniliforme Negative    44. Aureobasidium pullulans (pullulara) Negative    45. Rhizopus oryzae Negative    46. Botrytis cinera Negative    47. Epicoccum nigrum Negative    48. Phoma betae Negative     49. Candida Albicans Negative    50. Trichophyton mentagrophytes Negative    51. Mite, D Farinae  5,000 AU/ml Negative    52. Mite, D Pteronyssinus  5,000 AU/ml Negative    53. Cat Hair 10,000 BAU/ml Negative    54.  Dog Epithelia Negative    55. Mixed Feathers Negative    56. Horse Epithelia Negative    57. Cockroach, German Negative    58. Mouse Negative    59. Tobacco Leaf Negative             Intradermal - 11/19/21 1622     Time Antigen Placed 1622    Allergen Manufacturer Waynette Buttery    Location Arm    Number of Test 15    Control Negative    French Southern Territories Negative    Johnson Negative    7 Grass Negative    Ragweed mix Negative    Weed mix Negative    Tree mix Negative    Mold 1 Negative    Mold 2 Negative    Mold 3 Negative    Mold 4 Negative    Cat Negative    Dog Negative    Cockroach Negative    Mite mix Negative

## 2021-11-19 NOTE — Progress Notes (Signed)
NEW PATIENT  Date of Service/Encounter:  11/19/21  Consult requested by: Earl Many, MD   Assessment:   Chronic conjunctivitis of both eyes - with negative prick and intradermal testing  Possible contact dermatitis to eye drops  Plan/Recommendations:   1. Chronic conjunctivitis of both eyes - Testing was negative to the entire panel, both the prick testing and the intradermal testing. - Copy of testing results provided. - This is clearly NOT environmental allergies. - I would stop one of your eye drops for one week.  - If there is no improvement, stop the other eye drop for one week. - This would help to determine whether these are related to a reaction to your eye drops.   2. Return if symptoms worsen or fail to improve.   This note in its entirety was forwarded to the Provider who requested this consultation.  Subjective:   Erin Jones is a 61 y.o. female presenting today for evaluation of  Chief Complaint  Patient presents with   Allergy Testing    Environmental    Allergic Rhinitis     Eye swelling with eye crust, constant runny nose/ sneezing, and puffy face/ eyes.  Takes zyrtec daily, and singular at night, and takes Claritin on/ off    Other    Possible mold allergy     Erin Jones has a history of the following: Patient Active Problem List   Diagnosis Date Noted   Dysuria 04/06/2020   Diverticulitis of large intestine with perforation without abscess or bleeding 03/12/2020   Rheumatoid arthritis (HCC) 12/08/2014    History obtained from: chart review and patient.  Erin Jones was referred by Earl Many, MD.     Erin Jones is a 61 y.o. female presenting for an evaluation of allergic rhinitis .   Asthma/Respiratory Symptom History: She was actually placed on the montelukast years ago due to sinus infections. She has never had asthma and has not had an inhaler at all.    Allergic Rhinitis Symptom History: She has been having issues with  ocular itching and ocular discharge a couple of years. She has been to see her eye doctors and she is told that this is likely allergies. She has itchy eyes and everything is itching including her ears. She is having a lot of sneezing and whatnot. She thinks that thi sis coming from allergies more than anything else.  She does sometimes have some runny nose in the morning mostly. She has been on cetirizine and montelukast for years. She thought that this helps but it seems that this is becoming less effective over time. She did switch over to Claritin and she discontinued that because of the testing. She thinks that it felt a bit different.   Otherwise, there is no history of other atopic diseases, including asthma, food allergies, drug allergies, stinging insect allergies, eczema, urticaria, or contact dermatitis. There is no significant infectious history. Vaccinations are up to date.    Past Medical History: Patient Active Problem List   Diagnosis Date Noted   Dysuria 04/06/2020   Diverticulitis of large intestine with perforation without abscess or bleeding 03/12/2020   Rheumatoid arthritis (HCC) 12/08/2014    Medication List:  Allergies as of 11/19/2021       Reactions   Sulfa Antibiotics    Other Rash   Neosporin        Medication List        Accurate as of November 19, 2021 11:59  PM. If you have any questions, ask your nurse or doctor.          ALPRAZolam 0.5 MG tablet Commonly known as: XANAX Take 0.5 mg by mouth 2 (two) times daily as needed.   Biotin 5 MG Caps 1 capsule   Calcium + D [phone] MG-UNT-MCG Chew Generic drug: Calcium-Vitamin D-Vitamin K   calcium carbonate 1250 (500 Ca) MG chewable tablet Commonly known as: OS-CAL Chew 1 tablet by mouth 2 (two) times daily.   cetirizine 10 MG tablet Commonly known as: ZYRTEC Take 10 mg by mouth daily.   docusate sodium 100 MG capsule Commonly known as: COLACE Take 1 capsule (100 mg total) by mouth 2 (two)  times daily. Hold for diarrhea   Enbrel 50 MG/ML injection Generic drug: etanercept Inject into the skin.   Eszopiclone 3 MG Tabs Take 3 mg by mouth at bedtime as needed (for sleep).   folic acid 1 MG tablet Commonly known as: FOLVITE Take 1 mg by mouth daily.   meloxicam 7.5 MG tablet Commonly known as: MOBIC Take 1 tablet (7.5 mg total) by mouth daily. Hold for one week.   montelukast 10 MG tablet Commonly known as: SINGULAIR Take 10 mg by mouth at bedtime.   multivitamin capsule Take 1 capsule by mouth daily.   ondansetron 8 MG disintegrating tablet Commonly known as: ZOFRAN-ODT Take 1 tablet (8 mg total) by mouth every 8 (eight) hours as needed for nausea or vomiting.   pilocarpine 5 MG tablet Commonly known as: SALAGEN Take 5 mg by mouth in the morning, at noon, and at bedtime.   Restasis 0.05 % ophthalmic emulsion Generic drug: cycloSPORINE Place 1 drop into both eyes 2 (two) times daily.   Rinvoq 15 MG Tb24 Generic drug: Upadacitinib ER Take 15 mg by mouth daily. Contact your rheumatologist for instructions about resumption time on this drug with acute infection.   valACYclovir 500 MG tablet Commonly known as: VALTREX Take 500 mg by mouth daily.        Birth History: non-contributory  Developmental History: non-contributory  Past Surgical History: Past Surgical History:  Procedure Laterality Date   ADENOIDECTOMY     BUNIONECTOMY     COLPOSCOPY     TYMPANOSTOMY TUBE PLACEMENT       Family History: Family History  Problem Relation Age of Onset   Breast cancer Maternal Aunt    Thyroid disease Mother      Social History: Erin Jones lives at home with her family.  She lives in a house that is 61 years old.  There is hardwood throughout the home.  She has electric heating and central cooling.  There is 1 cat in the home.  There are no dust mite covers on the bedding.  There is no tobacco exposure.  She is not exposed to fumes, chemicals, or dust.  She  does not have a HEPA filter in the home.  She does not live near an interstate or industrial area. 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    Review of Systems  Constitutional: Negative.  Negative for fever, malaise/fatigue and weight loss.  HENT: Negative.  Negative for congestion, ear discharge and ear pain.   Eyes:  Positive for discharge and redness. Negative for pain.       Positive for ocular itching.   Respiratory:  Negative for cough, sputum production, shortness of breath and wheezing.   Cardiovascular: Negative.  Negative for chest pain and palpitations.  Gastrointestinal:  Negative for abdominal pain and heartburn.  Skin: Negative.  Negative for itching and rash.  Neurological:  Negative for dizziness and headaches.  Endo/Heme/Allergies:  Negative for environmental allergies. Does not bruise/bleed easily.      Objective:   Blood pressure 132/84, pulse 92, temperature 98.2 F (36.8 C), resp. rate 18, height  (1.651 m), weight 138 lb 12.8 oz (63 kg), SpO2 97 %. Body mass index is 23.1 kg/m.   Physical Exam Vitals reviewed.  Constitutional:      Appearance: She is well-developed.  HENT:     Head: Normocephalic and atraumatic.     Right Ear: Tympanic membrane, ear canal and external ear normal. No drainage, swelling or tenderness. Tympanic membrane is not injected, scarred, erythematous, retracted or bulging.     Left Ear: Tympanic membrane, ear canal and external ear normal. No drainage, swelling or tenderness. Tympanic membrane is not injected, scarred, erythematous, retracted or bulging.     Nose: No nasal deformity, septal deviation, mucosal edema or rhinorrhea.     Right Sinus: No maxillary sinus tenderness or frontal sinus tenderness.     Left Sinus: No maxillary sinus tenderness or frontal sinus tenderness.     Mouth/Throat:     Mouth: Mucous membranes are not pale and not dry.     Pharynx: Uvula midline.  Eyes:     General: Lids are normal.  Allergic shiner present.        Right eye: Discharge present.        Left eye: Discharge present.    Conjunctiva/sclera: Conjunctivae normal.     Right eye: Right conjunctiva is not injected. No chemosis.    Left eye: Left conjunctiva is not injected. No chemosis.    Pupils: Pupils are equal, round, and reactive to light.  Cardiovascular:     Rate and Rhythm: Normal rate and regular rhythm.     Heart sounds: Normal heart sounds.  Pulmonary:     Effort: Pulmonary effort is normal. No tachypnea, accessory muscle usage or respiratory distress.     Breath sounds: Normal breath sounds. No wheezing, rhonchi or rales.  Chest:     Chest wall: No tenderness.  Abdominal:     Tenderness: There is no abdominal tenderness. There is no guarding or rebound.  Lymphadenopathy:     Head:     Right side of head: No submandibular, tonsillar or occipital adenopathy.     Left side of head: No submandibular, tonsillar or occipital adenopathy.     Cervical: No cervical adenopathy.  Skin:    Coloration: Skin is not pale.     Findings: No abrasion, erythema, petechiae or rash. Rash is not papular, urticarial or vesicular.  Neurological:     Mental Status: She is alert.     Diagnostic studies:   Allergy Studies:     Airborne Adult Perc - 11/19/21 1622     Time Antigen Placed 1522    Allergen Manufacturer Waynette Buttery    Location Back    Number of Test 59    1. Control-Buffer 50% Glycerol Negative    2. Control-Histamine 1 mg/ml 2+    3. Albumin saline Negative    4. Bahia Negative    5. French Southern Territories Negative    6. Johnson Negative    7. [place] Blue Negative    8. Meadow Fescue Negative    9. Perennial Rye Negative    10. Sweet Vernal Negative    11. Timothy Negative    12.  Cocklebur Negative    13. Burweed Marshelder Negative    14. Ragweed, short Negative    15. Ragweed, Giant Negative    16. Plantain,  English Negative    17. Lamb's Quarters Negative    18. Sheep Sorrell Negative    19. Rough  Pigweed Negative    20. Marsh Elder, Rough Negative    21. Mugwort, Common Negative    22. Ash mix Negative    23. Birch mix Negative    24. Beech American Negative    25. Box, Elder Negative    26. Cedar, red Negative    27. Cottonwood, Guinea-BissauEastern Negative    28. Elm mix Negative    29. Hickory Negative    30. Maple mix Negative    31. Oak, Guinea-BissauEastern mix Negative    32. Pecan Pollen Negative    33. Pine mix Negative    34. Sycamore Eastern Negative    35. Walnut, Black Pollen Negative    36. Alternaria alternata Negative    37. Cladosporium Herbarum Negative    38. Aspergillus mix Negative    39. Penicillium mix Negative    40. Bipolaris sorokiniana (Helminthosporium) Negative    41. Drechslera spicifera (Curvularia) Negative    42. Mucor plumbeus Negative    43. Fusarium moniliforme Negative    44. Aureobasidium pullulans (pullulara) Negative    45. Rhizopus oryzae Negative    46. Botrytis cinera Negative    47. Epicoccum nigrum Negative    48. Phoma betae Negative    49. Candida Albicans Negative    50. Trichophyton mentagrophytes Negative    51. Mite, D Farinae  5,000 AU/ml Negative    52. Mite, D Pteronyssinus  5,000 AU/ml Negative    53. Cat Hair 10,000 BAU/ml Negative    54.  Dog Epithelia Negative    55. Mixed Feathers Negative    56. Horse Epithelia Negative    57. Cockroach, German Negative    58. Mouse Negative    59. Tobacco Leaf Negative             Intradermal - 11/19/21 1622     Time Antigen Placed 1622    Allergen Manufacturer Waynette ButteryGreer    Location Arm    Number of Test 15    Control Negative    French Southern TerritoriesBermuda Negative    Johnson Negative    7 Grass Negative    Ragweed mix Negative    Weed mix Negative    Tree mix Negative    Mold 1 Negative    Mold 2 Negative    Mold 3 Negative    Mold 4 Negative    Cat Negative    Dog Negative    Cockroach Negative    Mite mix Negative             Allergy testing results were read and interpreted by myself,  documented by clinical staff.         Malachi BondsJoel Chandel Zaun, MD [facility]

## 2021-11-21 ENCOUNTER — Encounter: Payer: Self-pay | Admitting: Allergy & Immunology

## 2022-09-25 ENCOUNTER — Other Ambulatory Visit (HOSPITAL_COMMUNITY): Payer: Self-pay

## 2022-09-26 ENCOUNTER — Ambulatory Visit (HOSPITAL_COMMUNITY)
Admission: RE | Admit: 2022-09-26 | Discharge: 2022-09-26 | Disposition: A | Discharge status: Home / Self Care | Payer: BC Managed Care – PPO | Source: Ambulatory Visit | Attending: Sports Medicine | Admitting: Sports Medicine

## 2022-09-26 DIAGNOSIS — M810 Age-related osteoporosis without current pathological fracture: Secondary | ICD-10-CM | POA: Diagnosis not present

## 2022-09-26 MED ORDER — ZOLEDRONIC ACID 5 MG/100ML IV SOLN
INTRAVENOUS | Status: AC
  Administered 2022-09-26: 5 mg via INTRAVENOUS
  Filled 2022-09-26: qty 100

## 2022-09-26 MED ORDER — ZOLEDRONIC ACID 5 MG/100ML IV SOLN: 5.0000 mg | Freq: Once | INTRAVENOUS | Status: AC

## 2022-12-06 ENCOUNTER — Ambulatory Visit (INDEPENDENT_AMBULATORY_CARE_PROVIDER_SITE_OTHER): Payer: BC Managed Care – PPO | Admitting: Allergy & Immunology

## 2022-12-06 ENCOUNTER — Other Ambulatory Visit: Payer: Self-pay

## 2022-12-06 ENCOUNTER — Encounter: Payer: Self-pay | Admitting: Allergy & Immunology

## 2022-12-06 VITALS — BP 138/72 | HR 100 | Temp 97.7°F | Resp 20 | Ht 65.0 in | Wt 131.8 lb

## 2022-12-06 DIAGNOSIS — R600 Localized edema: Secondary | ICD-10-CM

## 2022-12-06 DIAGNOSIS — H10403 Unspecified chronic conjunctivitis, bilateral: Secondary | ICD-10-CM | POA: Diagnosis not present

## 2022-12-06 MED ORDER — AMOXICILLIN-POT CLAVULANATE 875-125 MG PO TABS: 1.0000 | ORAL_TABLET | Freq: Two times a day (BID) | ORAL | 0 refills | Status: DC

## 2022-12-06 MED ORDER — AMOXICILLIN-POT CLAVULANATE 875-125 MG PO TABS: 1.0000 | ORAL_TABLET | Freq: Two times a day (BID) | ORAL | 0 refills | Status: AC

## 2022-12-06 NOTE — Progress Notes (Unsigned)
FOLLOW UP  Date of Service/Encounter:  12/06/22   Assessment:   Chronic conjunctivitis of both eyes - with negative prick and intradermal testing   Possible contact dermatitis to eye drops  Plan/Recommendations:    There are no Patient Instructions on file for this visit.   Subjective:   Erin Jones is a 62 y.o. female presenting today for follow up of  Chief Complaint  Patient presents with   Other    Facial swelling and sinus pressure    Erin Jones has a history of the following: Patient Active Problem List   Diagnosis Date Noted   Dysuria 04/06/2020   Diverticulitis of large intestine with perforation without abscess or bleeding 03/12/2020   Rheumatoid arthritis ([place]) 12/08/2014    History obtained from: chart review and {Persons; PED relatives w/patient:19415::"patient"}.  Erin Jones is a 62 y.o. female presenting for {Blank single:19197::"a food challenge","a drug challenge","skin testing","a sick visit","an evaluation of ***","a follow up visit"}.  She was last seen in February 2023.  At that time, she had conjunctivitis of both eyes.  He had testing that was positive to the entire panel, including intradermal testing.  We felt this was not environmental allergies.  We recommended stopping an eyedrop for a week at a time to see which change improved her symptoms.  She was asked to follow-up as needed.  Since last visit,  She stopped the Restasis for a "good while" and her dry eyes returned. She then added it back on night only. She is also on loteprednol which she is using once daily.   Bilateral periorbital swelling   She got prednisone prescribed again and an eye drop. She started the prednisone taper last night. She did start an antibiotic eye drop. She is on ofloxacin eye drops.    {Blank single:19197::"Asthma/Respiratory Symptom History: ***"," "}  Allergic Rhinitis Symptom History: She has been having the   {Blank single:19197::"Food Allergy  Symptom History: ***"," "}  {Blank single:19197::"Skin Symptom History: ***"," "}  {Blank single:19197::"GERD Symptom History: ***"," "}  She was diagnosed with RA around 30 years. She is currently on MTX for all of those years. She started the Enbrel or another biologic for the last 4-5 years. She sees Erin Jones. She sees him every 6 months and he gets blood work every 3 months.   Otherwise, there have been no changes to her past medical history, surgical history, family history, or social history.    ROS     Objective:   Blood pressure 138/72, pulse 100, temperature 97.7 F (36.5 C), resp. rate 20, height '5\' 5"'$  (1.651 m), weight 131 lb 12.8 oz (59.8 kg), SpO2 98 %. Body mass index is 21.93 kg/m.    Physical Exam   Diagnostic studies: {Blank single:19197::"none","deferred due to recent antihistamine use","labs sent instead"," "}  Spirometry: {Blank single:19197::"results normal (FEV1: ***%, FVC: ***%, FEV1/FVC: ***%)","results abnormal (FEV1: ***%, FVC: ***%, FEV1/FVC: ***%)"}.    {Blank single:19197::"Spirometry consistent with mild obstructive disease","Spirometry consistent with moderate obstructive disease","Spirometry consistent with severe obstructive disease","Spirometry consistent with possible restrictive disease","Spirometry consistent with mixed obstructive and restrictive disease","Spirometry uninterpretable due to technique","Spirometry consistent with normal pattern"}. {Blank single:19197::"Albuterol/Atrovent nebulizer","Xopenex/Atrovent nebulizer","Albuterol nebulizer","Albuterol four puffs via MDI","Xopenex four puffs via MDI"} treatment given in clinic with {Blank single:19197::"significant improvement in FEV1 per ATS criteria","significant improvement in FVC per ATS criteria","significant improvement in FEV1 and FVC per ATS criteria","improvement in FEV1, but not significant per ATS criteria","improvement in FVC, but not significant per ATS criteria","improvement  in FEV1  and FVC, but not significant per ATS criteria","no improvement"}.  Allergy Studies: {Blank single:19197::"none","labs sent instead"," "}    {Blank single:19197::"Allergy testing results were read and interpreted by myself, documented by clinical staff."," "}      Erin Marvel, MD  [facility]

## 2022-12-06 NOTE — Patient Instructions (Addendum)
1. Periorbital edema of both eyes and sinus pressure - We are going to get some labs to look into what is going on with the swelling. - C1 Esterase Inhibitor - C1 esterase inhibitor, functional - C3 and C4 - Complement component c1q - Tryptase - Alpha-Gal Panel - Thyroid antibodies - Sedimentation rate - C-reactive protein - ANA, IFA (with reflex) - We are going to get some blood work to confirm the environmental allergy testing that we did last time with some blood work. - Your testing at the last visit was negative, which was confusing given your history (so the blood work should confirm that).  - We are going to send in Augmentin twice daily for 7 days which you can start if you are not feeling better in the next day or two.  - We will call you in 1-2 weeks with the results of the testing.   2. Return in about 3 months (around 03/08/2023).    Please inform us of any Emergency Department visits, hospitalizations, or changes in symptoms. Call us before going to the ED for breathing or allergy symptoms since we might be able to fit you in for a sick visit. Feel free to contact us anytime with any questions, problems, or concerns.  It was a pleasure to see you again today!  Websites that have reliable patient information: 1. American Academy of Asthma, Allergy, and Immunology: www.aaaai.org 2. Food Allergy Research and Education (FARE): foodallergy.org 3. Mothers of Asthmatics: http://www.asthmacommunitynetwork.org 4. American College of Allergy, Asthma, and Immunology: www.acaai.org   COVID-19 Vaccine Information can be found at: ShippingScam.co.uk For questions related to vaccine distribution or appointments, please email [email or web address] or call [phone].   We realize that you might be concerned about having an allergic reaction to the COVID19 vaccines. To help with that concern, WE ARE OFFERING THE COVID19 VACCINES  IN OUR OFFICE! Ask the front desk for dates!     "Like" [place] on Facebook and Instagram for our latest updates!      A healthy democracy works best when [employer] participate! Make sure you are registered to vote! If you have moved or changed any of your contact information, you will need to get this updated before voting!  In some cases, you MAY be able to register to vote online: [email or web address]

## 2022-12-10 ENCOUNTER — Encounter: Payer: Self-pay | Admitting: Allergy & Immunology

## 2022-12-17 LAB — ALLERGENS W/COMP RFLX AREA 2
Alternaria Alternata IgE: <0.1 kU/L
Aspergillus Fumigatus IgE: <0.1 kU/L
Bermuda Grass IgE: <0.1 kU/L
Cedar, Mountain IgE: <0.1 kU/L
Cladosporium Herbarum IgE: <0.1 kU/L
Cockroach, German IgE: <0.1 kU/L
Common Silver Birch IgE: <0.1 kU/L
Cottonwood IgE: <0.1 kU/L
D Farinae IgE: <0.1 kU/L
D Pteronyssinus IgE: <0.1 kU/L
E001-IgE Cat Dander: <0.1 kU/L
E005-IgE Dog Dander: <0.1 kU/L
Elm, American IgE: <0.1 kU/L
Johnson Grass IgE: <0.1 kU/L
Maple/Box Elder IgE: <0.1 kU/L
Mouse Urine IgE: <0.1 kU/L
Oak, White IgE: <0.1 kU/L
Pecan, Hickory IgE: <0.1 kU/L
Penicillium Chrysogen IgE: <0.1 kU/L
Pigweed, Rough IgE: <0.1 kU/L
Ragweed, Short IgE: <0.1 kU/L
Sheep Sorrel IgE Qn: <0.1 kU/L
Timothy Grass IgE: <0.1 kU/L
White Mulberry IgE: <0.1 kU/L

## 2022-12-17 LAB — SEDIMENTATION RATE: Sed Rate: 4 mm/hr (ref 0–40)

## 2022-12-17 LAB — THYROID ANTIBODIES
Thyroglobulin Antibody: <1 IU/mL (ref 0.0–0.9)
Thyroperoxidase Ab SerPl-aCnc: <9 IU/mL (ref 0–34)

## 2022-12-17 LAB — ALPHA-GAL PANEL
Allergen Lamb IgE: <0.1 kU/L
Beef IgE: <0.1 kU/L
IgE (Immunoglobulin E), Serum: 24 IU/mL (ref 6–495)
O215-IgE Alpha-Gal: <0.1 kU/L
Pork IgE: <0.1 kU/L

## 2022-12-17 LAB — C1 ESTERASE INHIBITOR, FUNCTIONAL: C1INH Functional/C1INH Total MFr SerPl: >93 %mean normal

## 2022-12-17 LAB — C1 ESTERASE INHIBITOR: C1INH SerPl-mCnc: 37 mg/dL (ref 21–39)

## 2022-12-17 LAB — C-REACTIVE PROTEIN: CRP: <1 mg/L (ref 0–10)

## 2022-12-17 LAB — TRYPTASE: Tryptase: 4.2 ug/L (ref 2.2–13.2)

## 2022-12-17 LAB — C3 AND C4
Complement C3, Serum: 111 mg/dL (ref 82–167)
Complement C4, Serum: 17 mg/dL (ref 12–38)

## 2022-12-17 LAB — ANTINUCLEAR ANTIBODIES, IFA: ANA Titer 1: NEGATIVE

## 2022-12-17 LAB — COMPLEMENT COMPONENT C1Q: Complement C1Q: 12.4 mg/dL (ref 10.3–20.5)

## 2023-01-02 ENCOUNTER — Telehealth: Payer: Self-pay | Admitting: *Deleted

## 2023-01-02 NOTE — Telephone Encounter (Signed)
L/m for patient to contact me to discuss Orladeyo for her angioedema. Ins has denied coverage but [employer] has program in place to get patient on drug while they do appeals

## 2023-01-02 NOTE — Telephone Encounter (Signed)
-----   Message from Valentina Shaggy, MD sent at 12/18/2022  8:46 AM EDT ----- MyChart message sent. Maybe Ordaleyo??

## 2023-01-08 ENCOUNTER — Telehealth: Payer: Self-pay

## 2023-01-08 NOTE — Telephone Encounter (Signed)
LVM for patient to call the office back to discuss medications.

## 2023-01-08 NOTE — Telephone Encounter (Signed)
Patient called requesting a call to discuss her medications.   Please call [phone]

## 2023-01-09 NOTE — Telephone Encounter (Signed)
Called and left a voicemail asking for return call to discuss.  

## 2023-01-10 NOTE — Telephone Encounter (Signed)
Attempted to call patient at phone number provided, there was no answer and voicemail was full. Will need to attempt to call again.

## 2023-01-10 NOTE — Telephone Encounter (Signed)
Patient is returning Ashleigh's call from yesterday regarding her medications. [phone] is the best number to call her at.

## 2023-01-14 NOTE — Telephone Encounter (Signed)
Called and spoke with the patient and she stated that she had more questions about the Elliot Dally and the process with the pharmacy and insurance issues. I advised that you would be back in office on Monday, she stated that she was fine with waiting until you returned.

## 2023-01-24 NOTE — Telephone Encounter (Signed)
OK sounds good. Thanks!   Ira Dougher, MD [facility]  

## 2023-01-24 NOTE — Telephone Encounter (Signed)
L/m for patient to contact me again ?

## 2023-01-24 NOTE — Telephone Encounter (Signed)
Talked at length to patient regarding her starting Elliot Dally and she does not want to start at this time she feels that it is allergy related and wants to make appt to come an discuss other treatments for allergies

## 2023-01-28 NOTE — Telephone Encounter (Signed)
Spoke to patient and she does not want to start Xolair at this time

## 2023-02-25 NOTE — Telephone Encounter (Signed)
Patient cell phone voicemail is full. Called the home phone and her husband took a message for her to call us back.

## 2023-03-21 NOTE — Telephone Encounter (Signed)
This is the patient I was asking about yesterday at lunch.   Any luck getting a hold of her?   Malachi Bonds, MD [facility]

## 2023-03-24 NOTE — Telephone Encounter (Signed)
She is already on the schedule to see anne on 03/26/2023.  Thanks

## 2023-03-24 NOTE — Telephone Encounter (Signed)
Awesome - thank you, Dee!   Lexis Potenza, MD [facility]  

## 2023-03-26 ENCOUNTER — Ambulatory Visit (INDEPENDENT_AMBULATORY_CARE_PROVIDER_SITE_OTHER): Payer: BC Managed Care – PPO | Admitting: Family Medicine

## 2023-03-26 ENCOUNTER — Other Ambulatory Visit: Payer: Self-pay

## 2023-03-26 ENCOUNTER — Encounter: Payer: Self-pay | Admitting: Family Medicine

## 2023-03-26 VITALS — BP 146/72 | HR 87 | Temp 98.1°F | Resp 16 | Wt 130.4 lb

## 2023-03-26 DIAGNOSIS — J310 Chronic rhinitis: Secondary | ICD-10-CM | POA: Diagnosis not present

## 2023-03-26 DIAGNOSIS — H10403 Unspecified chronic conjunctivitis, bilateral: Secondary | ICD-10-CM | POA: Diagnosis not present

## 2023-03-26 DIAGNOSIS — R600 Localized edema: Secondary | ICD-10-CM

## 2023-03-26 DIAGNOSIS — M069 Rheumatoid arthritis, unspecified: Secondary | ICD-10-CM

## 2023-03-26 NOTE — Patient Instructions (Signed)
Chronic conjunctivitis Continue an antihistamine once a day as needed for a runny nose Begin Ryaltris 2 sprays in each nostril twice a day as needed for nasal symptoms. This will replace Flonase Consider saline nasal rinses as needed for nasal symptoms. Use this before any medicated nasal sprays for best result  Chronic conjunctivitis Some over the counter eye drops include Pataday one drop in each eye once a day as needed for red, itchy eyes OR Zaditor one drop in each eye twice a day as needed for red itchy eyes. Avoid eye drops that say red eye relief as they may contain medications that dry out your eyes.   Periocular swelling If your symptoms re-occur, begin a journal of events that occurred for up to 6 hours before your symptoms began including foods and beverages consumed, soaps or perfumes you had contact with, and medications.   Rheumatoid arthritis Continue to follow-up with Dr. Dierdre Forth as recommended  Call the clinic if this treatment plan is not working well for you.  Follow up in 6 months or sooner if needed.

## 2023-03-26 NOTE — Progress Notes (Signed)
   [address] [place] [address] Dept: [phone]  FOLLOW UP NOTE  Patient ID: Cecelia Byars, female    DOB: Dec 19, 1960  Age: 62 y.o. MRN: [id number] Date of Office Visit: 03/26/2023  Assessment  Chief Complaint: No chief complaint on file.  HPI Evoleth Deadre Schimming is a 62 year old female who presents to the clinic for follow-up visit.  She was last seen in this clinic on 12/06/2022 by Dr. Dellis Anes for chronic bilateral conjunctivitis versus contact dermatitis to her eyedrops.  She continues to see Dr. Dierdre Forth for rheumatoid arthritis for which she continues methotrexate and Enbrel.  She had a comprehensive evaluation for hereditary angioedema which was all negative or within normal limits.   Drug Allergies:  Allergies  Allergen Reactions   Hydroxychloroquine Sulfate     Plaquenil - suspected macular thickening   Sulfa Antibiotics    Other Rash    Neosporin    Physical Exam: There were no vitals taken for this visit.   Physical Exam  Diagnostics:    Assessment and Plan: No diagnosis found.  No orders of the defined types were placed in this encounter.   There are no Patient Instructions on file for this visit.  No follow-ups on file.    Thank you for the opportunity to care for this patient.  Please do not hesitate to contact me with questions.  Thermon Leyland, FNP [facility]

## 2023-03-27 DIAGNOSIS — J310 Chronic rhinitis: Secondary | ICD-10-CM | POA: Insufficient documentation

## 2023-03-27 DIAGNOSIS — H10403 Unspecified chronic conjunctivitis, bilateral: Secondary | ICD-10-CM | POA: Insufficient documentation

## 2023-03-27 DIAGNOSIS — R600 Localized edema: Secondary | ICD-10-CM | POA: Insufficient documentation

## 2023-03-27 MED ORDER — RYALTRIS 665-25 MCG/ACT NA SUSP: 2.0000 | Freq: Two times a day (BID) | NASAL | 5 refills | Status: AC | PRN

## 2023-04-25 ENCOUNTER — Telehealth: Payer: Self-pay | Admitting: Family Medicine

## 2023-04-25 NOTE — Telephone Encounter (Addendum)
Patient called stating that she received Ryaltris samples and it has been working for her. Patient states insurance will not cover the medication. Patient is wondering if there was something we can do to get insurance to cover it. [facility] [address] in [place].

## 2023-04-28 NOTE — Telephone Encounter (Addendum)
Left a message for patient to call the office in regards to Ryaltris. We can do a prior authorization however we will need to know what nasal sprays she has tried and failed. Flonase is the only one in her chart, will need to see if she has tried any other nasal sprays.

## 2023-04-29 NOTE — Telephone Encounter (Signed)
Attempted to call number provided, there was no answer and the voicemail was full. Will need to attempt to call again.

## 2023-04-29 NOTE — Telephone Encounter (Signed)
Patient called back to return a call that was made to her about her medication. Patient states we will not be able to reach her on her home phone. A good call back number is [phone].

## 2023-05-01 ENCOUNTER — Telehealth: Payer: Self-pay

## 2023-05-01 ENCOUNTER — Other Ambulatory Visit (HOSPITAL_COMMUNITY): Payer: Self-pay

## 2023-05-01 NOTE — Telephone Encounter (Signed)
Pharmacy Patient Advocate Encounter   Received notification from Pt Calls Messages that prior authorization for Ryaltris 665-25MCG/ACT suspension is required/requested.   Insurance verification completed.   The patient is insured through [facility] .   Per test claim: PA required; PA started via CoverMyMeds. KEY [id number] . Waiting for clinical questions to populate.

## 2023-05-02 NOTE — Telephone Encounter (Signed)
Additional questions populated and answered. Pending determination

## 2023-05-07 NOTE — Telephone Encounter (Signed)
Pharmacy Patient Advocate Encounter  Received notification from [facility] that Prior Authorization for Ryaltris 665-25MCG/ACT suspension has been DENIED. Please advise how you'd like to proceed. Full denial letter will be uploaded to the media tab. See denial reason below.  Your plan only covers this drug when you meet one of these options: A) You have tried other drugs your plan covers (preferred drugs), and they did not work well for you, or B) Your doctor gives [place] a medical reason you cannot take those other drugs. For your plan, you may need o try up to three preferred drugs. We have denied your request because you do not meet any of these conditions. Preferred drugs are: Azelastine-Fluticasone, Flunisolide, Fluticasone, Mometasone. Must try 3 (three)  PA #/Case ID/Reference #: [id number]  Please be advised we currently do not have a Pharmacist to review denials, therefore you will need to process appeals accordingly as needed. Thanks for your support at this time. Contact for appeals are as follows: Phone: [phone], Fax: [phone]

## 2023-05-09 NOTE — Telephone Encounter (Signed)
Called and left a message for patient to call out office back to inform her of denial and express what her insurance has noted.

## 2023-05-09 NOTE — Telephone Encounter (Signed)
Patient just called back returning a call made to her.

## 2023-05-09 NOTE — Telephone Encounter (Signed)
PA denied for Ryaltris as patient must try and fail two or more preferred nasal sprays: Dymista, fluticasone, mometasone, or flunisolide which insurance does cover.

## 2023-05-09 NOTE — Telephone Encounter (Signed)
Called and left a message for patient to call out office back to inform her of the PA denial.

## 2023-05-11 MED ORDER — AZELASTINE-FLUTICASONE 137-50 MCG/ACT NA SUSP: 2.0000 | Freq: Two times a day (BID) | NASAL | 5 refills | Status: DC

## 2023-05-11 NOTE — Addendum Note (Signed)
Addended by: Alfonse Spruce on: 05/11/2023 07:41 AM   Modules accepted: Orders

## 2023-05-11 NOTE — Telephone Encounter (Signed)
We can do Dymista two sprays per nostril BID.   Malachi Bonds, MD [facility]

## 2023-05-12 NOTE — Telephone Encounter (Signed)
Left a message for patient to call the office, we will need to inform her of a medication change from Ryaltris to Dymista 2 sprays twice a day as needed.

## 2023-05-14 NOTE — Telephone Encounter (Signed)
I called patient and informed of change.

## 2023-05-15 NOTE — Telephone Encounter (Signed)
Called patient in regards to the previous message. Patient states that she got a call yesterday about this and that azelastine was called in to replace the Ryaltris.

## 2023-09-02 ENCOUNTER — Other Ambulatory Visit: Payer: Self-pay

## 2023-09-02 ENCOUNTER — Telehealth: Payer: Self-pay

## 2023-09-02 DIAGNOSIS — M810 Age-related osteoporosis without current pathological fracture: Secondary | ICD-10-CM | POA: Insufficient documentation

## 2023-09-02 NOTE — Telephone Encounter (Signed)
error 

## 2023-09-03 ENCOUNTER — Telehealth: Payer: Self-pay

## 2023-09-03 NOTE — Telephone Encounter (Signed)
Auth Submission: APPROVED Site of care: Site of care: [facility] Payer: BCBS/ An[employer] Medication & CPT/J Code(s) submitted: Reclast (Zolendronic acid) [id number] Route of submission (phone, fax, portal): phone Phone # Fax # Auth type: Buy/Bill PB Units/visits requested: 1 dose Reference number: [id number] Approval from: 09/03/23 to 09/01/24

## 2023-09-29 ENCOUNTER — Ambulatory Visit: Payer: BC Managed Care – PPO

## 2023-10-03 ENCOUNTER — Encounter: Payer: BC Managed Care – PPO | Attending: Sports Medicine | Admitting: Internal Medicine

## 2023-10-03 VITALS — BP 137/93 | HR 78 | Temp 98.0°F | Resp 16

## 2023-10-03 DIAGNOSIS — M810 Age-related osteoporosis without current pathological fracture: Secondary | ICD-10-CM | POA: Diagnosis not present

## 2023-10-03 MED ORDER — ACETAMINOPHEN 325 MG PO TABS
650.0000 mg | ORAL_TABLET | Freq: Once | ORAL | Status: AC
  Administered 2023-10-03: 650 mg via ORAL

## 2023-10-03 MED ORDER — ZOLEDRONIC ACID 5 MG/100ML IV SOLN
5.0000 mg | Freq: Once | INTRAVENOUS | Status: AC
  Administered 2023-10-03: 5 mg via INTRAVENOUS

## 2023-10-03 MED ORDER — DIPHENHYDRAMINE HCL 25 MG PO CAPS
25.0000 mg | ORAL_CAPSULE | Freq: Once | ORAL | Status: AC
  Administered 2023-10-03: 25 mg via ORAL

## 2023-10-03 NOTE — Progress Notes (Signed)
Diagnosis: Osteoporosis  Provider:  Albertha Ghee MD  Procedure: IV Infusion  IV Type: Peripheral, IV Location: R Antecubital  Reclast (Zolendronic Acid), Dose: 5 mg  Infusion Start Time: 1515  Infusion Stop Time: 1544  Post Infusion IV Care: Observation period completed  Discharge: Condition: Good, Destination: Home . AVS Provided  Performed by:  Cleotilde Neer, LPN

## 2023-10-22 ENCOUNTER — Ambulatory Visit: Payer: BC Managed Care – PPO | Admitting: Allergy & Immunology

## 2024-05-20 ENCOUNTER — Ambulatory Visit (INDEPENDENT_AMBULATORY_CARE_PROVIDER_SITE_OTHER): Admitting: Podiatry

## 2024-05-20 ENCOUNTER — Ambulatory Visit (INDEPENDENT_AMBULATORY_CARE_PROVIDER_SITE_OTHER)

## 2024-05-20 DIAGNOSIS — M2042 Other hammer toe(s) (acquired), left foot: Secondary | ICD-10-CM

## 2024-05-20 DIAGNOSIS — D169 Benign neoplasm of bone and articular cartilage, unspecified: Secondary | ICD-10-CM

## 2024-05-21 NOTE — Progress Notes (Signed)
 Subjective:   Patient ID: Erin Jones, female   DOB: 63 y.o.   MRN: [id number]   HPI Patient presents stating she is having a lot of pain in her fifth toe that is pressing against her fourth toe and creating pressure.  States that she had her other ones work done years ago and that this is becoming increasingly bothersome she has tried to trim it she is tried to soak it without relief of symptoms.  Patient also has tried wider shoes and cushioning without relief and patient does not smoke likes to be active   Review of Systems  All other systems reviewed and are negative.       Objective:  Physical Exam Vitals and nursing note reviewed.  Constitutional:      Appearance: She is well-developed.  Pulmonary:     Effort: Pulmonary effort is normal.  Musculoskeletal:        General: Normal range of motion.  Skin:    General: Skin is warm.  Neurological:     Mental Status: She is alert.     Neurovascular status found to be intact muscle strength found to be adequate range of motion adequate with significant rotation digit 5 left pressing against the fourth digit left creating keratotic tissue formation between the 2 toes and a lot of pain with pressure.  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  Due to the intensity of discomfort and the significant malrotation of the fifth digit left patient opted for surgical intervention which given the history of this condition I think is in her best interest.  At this point I did allow her to read consent for going over alternative treatments complications and patient understands no guarantee as far as success of surgery and  at this time she signed consent form understanding risk and is scheduled for outpatient surgery.  I did do courtesy debridement to try to take pressure off until we can get this done and hopefully we can get this done within the next 3 weeks.  All questions answered and she is encouraged to call with questions  X-rays indicate that there is rotation of the fifth digit left pressing against the fourth digit left with excess ptotic lesion

## 2024-05-26 ENCOUNTER — Ambulatory Visit (INDEPENDENT_AMBULATORY_CARE_PROVIDER_SITE_OTHER): Admitting: Allergy & Immunology

## 2024-05-26 ENCOUNTER — Other Ambulatory Visit: Payer: Self-pay

## 2024-05-26 VITALS — BP 140/90 | HR 98 | Temp 98.3°F | Ht 65.0 in | Wt 133.8 lb

## 2024-05-26 DIAGNOSIS — M069 Rheumatoid arthritis, unspecified: Secondary | ICD-10-CM | POA: Diagnosis not present

## 2024-05-26 DIAGNOSIS — J310 Chronic rhinitis: Secondary | ICD-10-CM

## 2024-05-26 DIAGNOSIS — R600 Localized edema: Secondary | ICD-10-CM

## 2024-05-26 MED ORDER — AZELASTINE-FLUTICASONE 137-50 MCG/ACT NA SUSP: 2.0000 | Freq: Two times a day (BID) | NASAL | 5 refills | Status: AC

## 2024-05-26 MED ORDER — MONTELUKAST SODIUM 10 MG PO TABS: 10.0000 mg | ORAL_TABLET | Freq: Every day | ORAL | 5 refills | Status: AC

## 2024-05-26 NOTE — Progress Notes (Unsigned)
   FOLLOW UP  Date of Service/Encounter:  05/26/24   Assessment:   Chronic conjunctivitis of both eyes - with negative prick and intradermal testing   Possible contact dermatitis to eye drops   Low suspicion for allergic trigger  Plan/Recommendations:   There are no Patient Instructions on file for this visit.   Subjective:   Erin Jones is a 63 y.o. female presenting today for follow up of No chief complaint on file.   Erin Jones has a history of the following: Patient Active Problem List   Diagnosis Date Noted   Senile osteoporosis 09/02/2023   Chronic rhinitis 03/27/2023   Chronic conjunctivitis of both eyes 03/27/2023   Periorbital edema of both eyes 03/27/2023   Dysuria 04/06/2020   Diverticulitis of large intestine with perforation without abscess or bleeding 03/12/2020   Rheumatoid arthritis ([facility]) 12/08/2014    History obtained from: chart review and {Persons; PED relatives w/patient:19415::patient}.  Discussed the use of AI scribe software for clinical note transcription with the patient and/or guardian, who gave verbal consent to proceed.  Erin Jones is a 63 y.o. female presenting for {Blank single:19197::a food challenge,a drug challenge,skin testing,a sick visit,an evaluation of ***,a follow up visit}.  She was last seen in March 2024.  At that time, we obtain labs to look for hereditary angioedema as well as with causes of swelling and hives.  However this was normal.  When she saw Arlean in June 2024, she was started on Ryaltris  2 sprays per nostril twice daily.  She also was continued on Pataday.  She has rheumatoid arthritis and continue to follow with Dr. Mai.  Since last visit,  Asthma/Respiratory Symptom History: ***  Allergic Rhinitis Symptom History: ***  Food Allergy Symptom History: ***  Skin Symptom History: ***  GERD Symptom History: ***  Infection Symptom History: ***  Otherwise, there have been no changes to her past  medical history, surgical history, family history, or social history.    Review of systems otherwise negative other than that mentioned in the HPI.    Objective:   There were no vitals taken for this visit. There is no height or weight on file to calculate BMI.    Physical Exam   Diagnostic studies: {Blank single:19197::none,deferred due to recent antihistamine use,deferred due to insurance stipulations that require a separate visit for testing,labs sent instead, }  Spirometry: {Blank single:19197::results normal (FEV1: ***%, FVC: ***%, FEV1/FVC: ***%),results abnormal (FEV1: ***%, FVC: ***%, FEV1/FVC: ***%)}.    {Blank single:19197::Spirometry consistent with mild obstructive disease,Spirometry consistent with moderate obstructive disease,Spirometry consistent with severe obstructive disease,Spirometry consistent with possible restrictive disease,Spirometry consistent with mixed obstructive and restrictive disease,Spirometry uninterpretable due to technique,Spirometry consistent with normal pattern}. {Blank single:19197::Albuterol/Atrovent nebulizer,Xopenex/Atrovent nebulizer,Albuterol nebulizer,Albuterol four puffs via MDI,Xopenex four puffs via MDI} treatment given in clinic with {Blank single:19197::significant improvement in FEV1 per ATS criteria,significant improvement in FVC per ATS criteria,significant improvement in FEV1 and FVC per ATS criteria,improvement in FEV1, but not significant per ATS criteria,improvement in FVC, but not significant per ATS criteria,improvement in FEV1 and FVC, but not significant per ATS criteria,no improvement}.  Allergy Studies: {Blank single:19197::none,deferred due to recent antihistamine use,deferred due to insurance stipulations that require a separate visit for testing,labs sent instead, }    {Blank single:19197::Allergy testing results were read and interpreted by myself, documented  by clinical staff., }      Erin Shaggy, MD  [facility]

## 2024-05-26 NOTE — Patient Instructions (Addendum)
 1. Periorbital edema - All of your labs have been normal. - Information provided for Ordaleyo, which is a preventative medication that MIGHT help prevent these attacks of the swelling. - There is a bridge program with the company that will get it to you for free if your insurance denies it. - I think a couple of months on it might show whether it is going to help or not.  - There is no cross over on the mechanisms of action, but I will send to Dr. Mai to see what he thinks.  - You have had never skin and blood work to environmental allergy testing, so there is nothing we could add to shots.  - We could consider a sinus CT but you did not want any extra radiation exposure. - We could send to ENT, but you denied that.  - Take pictures of episodes. - Call us  if you change your mind about treatment options.   2. Return in about 1 year (around 05/26/2025). You can have the follow up appointment with Dr. Iva or a Nurse Practicioner (our Nurse Practitioners are excellent and always have Physician oversight!).    Please inform us  of any Emergency Department visits, hospitalizations, or changes in symptoms. Call us  before going to the ED for breathing or allergy symptoms since we might be able to fit you in for a sick visit. Feel free to contact us  anytime with any questions, problems, or concerns.  It was a pleasure to see you again today!  Websites that have reliable patient information: 1. American Academy of Asthma, Allergy, and Immunology: www.aaaai.org 2. Food Allergy Research and Education (FARE): foodallergy.org 3. Mothers of Asthmatics: http://www.asthmacommunitynetwork.org 4. American College of Allergy, Asthma, and Immunology: www.acaai.org      "Like" us  on Facebook and Instagram for our latest updates!      A healthy democracy works best when [employer] participate! Make sure you are registered to vote! If you have moved or changed any of your contact information, you  will need to get this updated before voting! Scan the QR codes below to learn more!

## 2024-05-28 ENCOUNTER — Encounter: Payer: Self-pay | Admitting: Allergy & Immunology

## 2024-06-01 ENCOUNTER — Telehealth: Payer: Self-pay | Admitting: Podiatry

## 2024-06-01 NOTE — Telephone Encounter (Signed)
 Received surgical consent  Left message for pt to call me to get the surgery scheduled.

## 2024-08-11 ENCOUNTER — Ambulatory Visit: Admitting: Podiatry

## 2024-08-11 DIAGNOSIS — M2042 Other hammer toe(s) (acquired), left foot: Secondary | ICD-10-CM | POA: Diagnosis not present

## 2024-08-12 NOTE — Progress Notes (Signed)
 Subjective:   Patient ID: Erin Jones, female   DOB: 63 y.o.   MRN: [id number]   HPI Patient states that she is ready to get this left foot fixed and she would like to review again and the fifth toe really bothers her pressing into the fourth toe neuro   ROS      Objective:  Physical Exam  Vascular status intact with the patient found to have significant rotation of the fifth digit left pressing into the fourth digit left with keratotic lesion between the 2 toes that is increasingly painful     Assessment:  Chronic hammertoe deformity digits 4 5 the left that she tried to get by without correction but it is getting worse again and she wants surgery as soon as possible     Plan:  I let her review again consent form for derotational arthroplasty digit 5 left and arthroplasty digit 4 left explaining procedure risk and patient wants surgery and after review signed consent form with all questions answered today.  She will be scheduled as soon as possible for surgical intervention in this case and I did do courtesy debridement

## 2024-08-16 ENCOUNTER — Telehealth: Payer: Self-pay | Admitting: Podiatry

## 2024-08-16 NOTE — Telephone Encounter (Signed)
 Called and left message for patient to return call to schedule surgery.

## 2024-08-16 NOTE — Telephone Encounter (Signed)
 Patient called back and left message to scheudle surgery. Called and left another message for patient to schedule surgery

## 2024-08-20 ENCOUNTER — Telehealth: Payer: Self-pay | Admitting: Podiatry

## 2024-08-20 NOTE — Telephone Encounter (Signed)
 Patient called stating she has left multiple voicemails regarding scheduling her surgery appointment. While verifying who to forward the message to, I noted that the scheduler attempted to contact her on 08/16/2024. The patient interrupted and stated that they only called once, asking if that is all they do--call one time. She expressed frustration and said she is unsure if she should even schedule the surgery now. I asked how I could assist and whether she would like me to check if the scheduler was available. She responded loudly, "Yes, that's why I'm calling." I asked if I may place her on hold to check to see if the scheduler was available.  While I was waiting on a response, the patient disconnected the call.

## 2024-08-23 NOTE — Telephone Encounter (Signed)
 Called and left another message for patient to schedule surgery.

## 2024-08-24 ENCOUNTER — Telehealth: Payer: Self-pay | Admitting: Podiatry

## 2024-08-24 NOTE — Telephone Encounter (Signed)
 Reached patient and surgery set for 09/21/2024 per her request. Patient not on any GLP1 meds or blood thinners. Patient pharmacy correct in chart.

## 2024-09-13 ENCOUNTER — Telehealth: Payer: Self-pay | Admitting: Podiatry

## 2024-09-13 NOTE — Telephone Encounter (Signed)
 DOS- 09/21/2024  4TH+5TH HAMMERTOE REPAIR LT- 71714  ANTHEM EFFECTIVE DATE- 10/07/2017  DEDUCTIBLE- $3300 REMAINING- $0 OOP- $6000 REMAINING- $0 COINSURANCE- 0%  PER CARELON PORTAL, PRIOR AUTH FOR CPT CODE 71714 (2 UNITS) HAS BEEN APPROVED FROM 09/21/2024-11/19/2024. AUTH# [id number]

## 2024-09-15 ENCOUNTER — Telehealth: Payer: Self-pay | Admitting: Podiatry

## 2024-09-15 NOTE — Telephone Encounter (Signed)
 Called and left message for patient as she left message in regards to possibly rescheduling surgery.

## 2024-09-27 ENCOUNTER — Encounter

## 2024-10-04 ENCOUNTER — Ambulatory Visit: Payer: BC Managed Care – PPO

## 2024-10-11 ENCOUNTER — Encounter

## 2024-11-10 ENCOUNTER — Telehealth: Payer: Self-pay

## 2024-11-10 NOTE — Telephone Encounter (Signed)
 Pt called to make appt for (self ref) veins of BLE (she feels R>L). She has occasional discomfort/swelling and was seen at another office that told her she had venous insufficiency and needed to have 4-6 treatments. She had sclerotherapy with them in the past and she was unhappy with outcome. She did not see improvement and had several years of what sounded like staining. She is a former interior and spatial designer, on her feet quite a bit. She is interested in discussing options. Appt has been made for reflux study and to see provider.

## 2024-11-29 ENCOUNTER — Ambulatory Visit (HOSPITAL_COMMUNITY)

## 2024-11-29 ENCOUNTER — Encounter

## 2025-02-01 ENCOUNTER — Ambulatory Visit (HOSPITAL_COMMUNITY)

## 2025-02-01 ENCOUNTER — Encounter: Admitting: Vascular Surgery

## 2025-05-25 ENCOUNTER — Ambulatory Visit: Admitting: Allergy & Immunology
